# Patient Record
Sex: Female | Born: 1945 | Race: White | Hispanic: No | Marital: Married | Smoking: Former smoker
Health system: Southern US, Community
[De-identification: ages and names within clinical notes are randomized; demographics above are authoritative.]

## PROBLEM LIST (undated history)

## (undated) DIAGNOSIS — R55 Syncope and collapse: Secondary | ICD-10-CM

## (undated) DIAGNOSIS — I2510 Atherosclerotic heart disease of native coronary artery without angina pectoris: Secondary | ICD-10-CM

## (undated) DIAGNOSIS — H40059 Ocular hypertension, unspecified eye: Secondary | ICD-10-CM

## (undated) DIAGNOSIS — J309 Allergic rhinitis, unspecified: Secondary | ICD-10-CM

## (undated) HISTORY — PX: TONSILLECTOMY: SUR1361

## (undated) HISTORY — PX: APPENDECTOMY: SHX54

## (undated) HISTORY — PX: TONSILLECTOMY: SHX5217

## (undated) HISTORY — PX: ABDOMINAL HYSTERECTOMY: SHX81

## (undated) HISTORY — PX: PACEMAKER INSERTION: SHX728

## (undated) HISTORY — PX: TUBAL LIGATION: SHX77

---

## 1997-10-17 ENCOUNTER — Other Ambulatory Visit: Admission: RE | Admit: 1997-10-17 | Discharge: 1997-10-17 | Payer: Self-pay | Admitting: Dermatology

## 2000-01-22 ENCOUNTER — Ambulatory Visit (HOSPITAL_COMMUNITY): Admission: RE | Admit: 2000-01-22 | Discharge: 2000-01-22 | Payer: Self-pay | Admitting: Gastroenterology

## 2000-01-26 ENCOUNTER — Emergency Department (HOSPITAL_COMMUNITY): Admission: EM | Admit: 2000-01-26 | Discharge: 2000-01-26 | Payer: Self-pay | Admitting: Emergency Medicine

## 2001-07-19 ENCOUNTER — Encounter: Admission: RE | Admit: 2001-07-19 | Discharge: 2001-07-19 | Payer: Self-pay | Admitting: Orthopaedic Surgery

## 2001-07-19 ENCOUNTER — Encounter: Payer: Self-pay | Admitting: Orthopaedic Surgery

## 2001-12-14 ENCOUNTER — Other Ambulatory Visit: Admission: RE | Admit: 2001-12-14 | Discharge: 2001-12-14 | Payer: Self-pay | Admitting: Obstetrics and Gynecology

## 2002-09-20 ENCOUNTER — Other Ambulatory Visit: Admission: RE | Admit: 2002-09-20 | Discharge: 2002-09-20 | Payer: Self-pay | Admitting: Radiology

## 2003-05-16 ENCOUNTER — Encounter: Admission: RE | Admit: 2003-05-16 | Discharge: 2003-05-16 | Payer: Self-pay | Admitting: Orthopaedic Surgery

## 2003-05-16 ENCOUNTER — Encounter: Payer: Self-pay | Admitting: Orthopaedic Surgery

## 2003-11-02 ENCOUNTER — Encounter: Admission: RE | Admit: 2003-11-02 | Discharge: 2003-11-02 | Payer: Self-pay | Admitting: Orthopaedic Surgery

## 2003-11-23 ENCOUNTER — Encounter: Admission: RE | Admit: 2003-11-23 | Discharge: 2003-11-23 | Payer: Self-pay | Admitting: Orthopaedic Surgery

## 2004-02-02 ENCOUNTER — Encounter: Admission: RE | Admit: 2004-02-02 | Discharge: 2004-02-02 | Payer: Self-pay | Admitting: Nephrology

## 2004-02-06 ENCOUNTER — Encounter: Admission: RE | Admit: 2004-02-06 | Discharge: 2004-02-06 | Payer: Self-pay | Admitting: Orthopaedic Surgery

## 2004-10-12 ENCOUNTER — Encounter: Admission: RE | Admit: 2004-10-12 | Discharge: 2004-10-12 | Payer: Self-pay

## 2006-05-21 ENCOUNTER — Encounter: Admission: RE | Admit: 2006-05-21 | Discharge: 2006-05-21 | Payer: Self-pay | Admitting: Obstetrics and Gynecology

## 2006-05-28 ENCOUNTER — Encounter: Admission: RE | Admit: 2006-05-28 | Discharge: 2006-05-28 | Payer: Self-pay | Admitting: Obstetrics and Gynecology

## 2007-05-25 ENCOUNTER — Encounter: Admission: RE | Admit: 2007-05-25 | Discharge: 2007-05-25 | Payer: Self-pay | Admitting: Obstetrics and Gynecology

## 2008-06-02 ENCOUNTER — Encounter: Admission: RE | Admit: 2008-06-02 | Discharge: 2008-06-02 | Payer: Self-pay | Admitting: Obstetrics and Gynecology

## 2009-06-05 ENCOUNTER — Encounter: Admission: RE | Admit: 2009-06-05 | Discharge: 2009-06-05 | Payer: Self-pay | Admitting: Obstetrics and Gynecology

## 2009-06-07 ENCOUNTER — Encounter: Admission: RE | Admit: 2009-06-07 | Discharge: 2009-06-07 | Payer: Self-pay | Admitting: Obstetrics and Gynecology

## 2010-06-13 ENCOUNTER — Encounter: Admission: RE | Admit: 2010-06-13 | Discharge: 2010-06-13 | Payer: Self-pay | Admitting: Geriatric Medicine

## 2010-08-19 ENCOUNTER — Encounter: Payer: Self-pay | Admitting: Orthopaedic Surgery

## 2010-08-19 ENCOUNTER — Encounter: Payer: Self-pay | Admitting: Obstetrics and Gynecology

## 2010-12-14 NOTE — Procedures (Signed)
[facility]  Patient:    Deborah Anderson, Deborah Anderson                         MRN: [id number] Proc. Date: 01/22/00 Adm. Date:  [id number] Attending:  Deneen Harts CC:         Lucrezia Starch. August Luz, M.D., [address] [place], [place] [address]             Eliberto Ivory. Rosalio Macadamia, M.D.                           Procedure Report  PROCEDURE:  Colonoscopy.  INDICATION FOR PROCEDURE:  A 65 year old white female, wife of Dr. Tana Felts, undergoing colonoscopy for neoplasia surveillance. Last examined April 1998 at which time diminutive adenomatous polyps were resected. No interim difficulty. Overall heath remains excellent.  DESCRIPTION OF PROCEDURE:  After reviewing the nature of the procedure with the patient including potential risks and complications, informed consent was signed.  The patient was premedicated receiving Versed 12 mg, fentanyl 100 mcg administered in divided doses prior to and during the course of the procedure.  Using an Olympus pediatric PCF-140L video colonoscope, the rectum was intubated after a normal digital examination. The scope was advanced cautiously through a very tortuous diverticular laden sigmoid colon. Beyond this, it was easily advanced into the cecum identified by the appendiceal orifice and ileocecal valve. Preparation was fair in the right colon, good throughout the remainder. Significant retained debris was irrigated clear as much as possible. Visualization was excellent throughout.  The scope was slowly withdrawn with careful inspection of the entire colon in a retrograde manner. No abnormality was noted except for the sigmoid diverticulosis which was moderate in extent. Retroflexed view in the rectal vault was normal. There no evidence of colorectal neoplasia, mucosal inflammation, or vascular lesion.  The colon was decompressed, scope withdrawn.  The patient tolerated the procedure without difficulty being maintained on DataScope  monitor low flow oxygen throughout.  Time 2, technical 2, preparation 2, total score equal 6.  ASSESSMENT: 1. Diverticulosis--moderate, limited to the sigmoid colon. 2. No recurrent colorectal neoplasia.  RECOMMENDATION: 1. Annual Hemoccult. 2. Repeat colonoscopy in 5 years. DD:  01/22/00 TD:  01/23/00 Job: [id number]

## 2010-12-19 ENCOUNTER — Encounter: Payer: Self-pay | Admitting: Vascular Surgery

## 2010-12-26 ENCOUNTER — Encounter: Payer: Self-pay | Admitting: Vascular Surgery

## 2010-12-28 ENCOUNTER — Telehealth: Payer: Self-pay | Admitting: Cardiology

## 2010-12-28 DIAGNOSIS — R011 Cardiac murmur, unspecified: Secondary | ICD-10-CM

## 2010-12-28 NOTE — Telephone Encounter (Signed)
Called in wanting to be scheduled to see Dr. Patty Sermons. There is no record of her ever seeing him in this office. 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 Claims that her and Dr. Patty Sermons discussed how he really wanted her to come in for an check up but he didn't leave any special notes on her. Please call back. There is no chart to pull.

## 2011-01-01 NOTE — Telephone Encounter (Signed)
Left message

## 2011-01-02 NOTE — Telephone Encounter (Signed)
Scheduled appointment and echo

## 2011-01-04 ENCOUNTER — Ambulatory Visit
Admission: RE | Admit: 2011-01-04 | Discharge: 2011-01-04 | Disposition: A | Discharge status: Home / Self Care | Payer: Medicare Other | Source: Ambulatory Visit | Attending: Cardiology | Admitting: Cardiology

## 2011-01-04 ENCOUNTER — Encounter: Payer: Self-pay | Admitting: Cardiology

## 2011-01-04 ENCOUNTER — Other Ambulatory Visit: Payer: Self-pay | Admitting: Cardiology

## 2011-01-04 ENCOUNTER — Ambulatory Visit (INDEPENDENT_AMBULATORY_CARE_PROVIDER_SITE_OTHER): Payer: Medicare Other | Admitting: Cardiology

## 2011-01-04 VITALS — BP 100/70 | HR 62 | Ht 64.0 in | Wt 148.2 lb

## 2011-01-04 DIAGNOSIS — R011 Cardiac murmur, unspecified: Secondary | ICD-10-CM

## 2011-01-04 DIAGNOSIS — R5383 Other fatigue: Secondary | ICD-10-CM

## 2011-01-04 DIAGNOSIS — Z139 Encounter for screening, unspecified: Secondary | ICD-10-CM

## 2011-01-04 DIAGNOSIS — Z13220 Encounter for screening for lipoid disorders: Secondary | ICD-10-CM

## 2011-01-04 NOTE — Progress Notes (Signed)
Deborah Anderson Date of Birth:  15-Dec-1945 [facility] [address]   [address] [place], [place]  [address] [phone]           Fax   [phone]  HPI: This 78 on woman is seen after a many year absence.  She has a past history of a heart murmur.  She has not been aware of any recent symptoms referable to her heart.  Specifically she denies chest pain or shortness of breath or palpitations.  She's had no dizziness or syncope.  His not having any constitutional symptoms she notes occasional fatigue but generally her energy level has been satisfactory.  She is a recent new grandmother to a set of twins  Current Outpatient Prescriptions  Medication Sig Dispense Refill  . ALPRAZolam (XANAX) 0.5 MG tablet Take 0.5 mg by mouth at bedtime as needed.        Marland Kitchen aspirin 81 MG tablet Take 81 mg by mouth daily.        . Multiple Vitamin (MULTIVITAMIN) tablet Take 1 tablet by mouth daily.          Allergies  Allergen Reactions  . Penicillins     Patient Active Problem List  Diagnoses  . Heart murmur    History  Smoking status  . Former Smoker  . Quit date: 07/30/1967  Smokeless tobacco  . Not on file    History  Alcohol Use  . Yes    Family History  Problem Relation Age of Onset  . Lung cancer Mother   . Diabetes Father   . Diabetes Sister   . Obesity Sister     Review of Systems: The patient denies any heat or cold intolerance.  No weight gain or weight loss.  The patient denies headaches or blurry vision.  There is no cough or sputum production.  The patient denies dizziness.  There is no hematuria or hematochezia.  The patient denies any muscle aches or arthritis.  The patient denies any rash.  The patient denies frequent falling or instability.  There is no history of depression or anxiety.  All other systems were reviewed and are negative.   Physical Exam: Filed Vitals:   01/04/11 1028  BP: 100/70  Pulse: 62  The general appearance  reveals a well-developed well-nourished woman in no distress.Pupils equal and reactive.   Extraocular Movements are full.  There is no scleral icterus.  The mouth and pharynx are normal.  The neck is supple.  The carotids reveal no bruits.  The jugular venous pressure is normal.  The thyroid is not enlarged.  There is no lymphadenopathy.The chest is clear to percussion and auscultation. There are no rales or rhonchi. Expansion of the chest is symmetrical.  Heart reveals a soft systolic ejection murmur at the base.  No diastolic murmur.  No abnormal lift or heave.  No gallop click or rub.The abdomen is soft and nontender. Bowel sounds are normal. The liver and spleen are not enlarged. There Are no abdominal masses. There are no bruits.  The breasts reveal no masses.  Normal extremity without phlebitis or edema neurologic exam is physiologic.The skin is warm and dry.  There is no rash.    Assessment / Plan: Impression: Systolic heart murmur uncertain etiology Plan: We'll have her return for baseline labs.  She does not know what her cholesterol status is.  Her return for fasting lipid panel and chemistries.  We will also obtain a chest x-ray and  a two-dimensional echocardiogram.

## 2011-01-04 NOTE — Assessment & Plan Note (Signed)
This pleasant 65 year old married Caucasian female is seen after a long absence.  She has a past history of a heart murmur.  We had seen her for this many years ago here in this office but those records are not currently available.  The patient does not recall what tests were done at that time.  The patient denies any chest pain or shortness of breath.  She's had no awareness of palpitations dizziness or syncope.  He's had no fever or chills or night sweats or constitutional symptoms.  Review of systems is essentially unremarkable.  She denies any gastrointestinal or genitourinary problems.  She denies any cough sputum production or asthma.  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  She is a nonsmoker.  She smoked for about 5 years many years ago.  She avoids much dietary salt.

## 2011-01-07 ENCOUNTER — Ambulatory Visit (HOSPITAL_COMMUNITY): Payer: Medicare Other | Attending: Cardiology

## 2011-01-07 ENCOUNTER — Telehealth: Payer: Self-pay | Admitting: *Deleted

## 2011-01-07 DIAGNOSIS — R011 Cardiac murmur, unspecified: Secondary | ICD-10-CM

## 2011-01-07 DIAGNOSIS — I059 Rheumatic mitral valve disease, unspecified: Secondary | ICD-10-CM | POA: Insufficient documentation

## 2011-01-07 DIAGNOSIS — R5381 Other malaise: Secondary | ICD-10-CM | POA: Insufficient documentation

## 2011-01-07 DIAGNOSIS — I079 Rheumatic tricuspid valve disease, unspecified: Secondary | ICD-10-CM | POA: Insufficient documentation

## 2011-01-07 NOTE — Telephone Encounter (Signed)
Advised of chest xray 

## 2011-01-07 NOTE — Progress Notes (Signed)
Advised wife  

## 2011-01-07 NOTE — Telephone Encounter (Signed)
Message copied by Burnell Blanks on Mon Jan 07, 2011  4:44 PM ------      Message from: Cassell Clement      Created: Sat Jan 05, 2011  9:25 AM       The chest xray is normal.  Please report.

## 2011-01-09 ENCOUNTER — Telehealth: Payer: Self-pay | Admitting: *Deleted

## 2011-01-09 ENCOUNTER — Encounter: Payer: Self-pay | Admitting: Vascular Surgery

## 2011-01-09 NOTE — Telephone Encounter (Signed)
Message copied by Burnell Blanks on Wed Jan 09, 2011  1:52 PM ------      Message from: Cassell Clement      Created: Tue Jan 08, 2011  9:30 PM       I called the patient about her echocardiogram which was quite good.  We are faxing copies of the echo and the chest x-ray to her home.  Recheck p.r.n.

## 2011-01-09 NOTE — Telephone Encounter (Signed)
Dr Patty Sermons advised patient.  I faxed to patient

## 2011-01-10 ENCOUNTER — Other Ambulatory Visit (HOSPITAL_COMMUNITY): Payer: Self-pay | Admitting: Radiology

## 2011-01-10 ENCOUNTER — Encounter: Payer: Self-pay | Admitting: Cardiology

## 2011-01-11 ENCOUNTER — Other Ambulatory Visit (HOSPITAL_COMMUNITY): Payer: Self-pay | Admitting: Radiology

## 2011-07-02 ENCOUNTER — Other Ambulatory Visit: Payer: Self-pay | Admitting: Geriatric Medicine

## 2011-07-02 DIAGNOSIS — Z1231 Encounter for screening mammogram for malignant neoplasm of breast: Secondary | ICD-10-CM

## 2011-07-05 ENCOUNTER — Encounter (HOSPITAL_COMMUNITY): Payer: Self-pay | Admitting: Emergency Medicine

## 2011-07-05 ENCOUNTER — Other Ambulatory Visit: Payer: Self-pay

## 2011-07-05 ENCOUNTER — Inpatient Hospital Stay (HOSPITAL_COMMUNITY)
Admission: EM | Admit: 2011-07-05 | Discharge: 2011-07-09 | DRG: 242 | Disposition: A | Discharge status: Home / Self Care | Payer: Medicare Other | Attending: Cardiology | Admitting: Cardiology

## 2011-07-05 DIAGNOSIS — I469 Cardiac arrest, cause unspecified: Secondary | ICD-10-CM | POA: Diagnosis present

## 2011-07-05 DIAGNOSIS — I495 Sick sinus syndrome: Principal | ICD-10-CM | POA: Diagnosis present

## 2011-07-05 DIAGNOSIS — R55 Syncope and collapse: Secondary | ICD-10-CM

## 2011-07-05 DIAGNOSIS — I455 Other specified heart block: Secondary | ICD-10-CM

## 2011-07-05 DIAGNOSIS — I472 Ventricular tachycardia, unspecified: Secondary | ICD-10-CM | POA: Diagnosis present

## 2011-07-05 DIAGNOSIS — I4729 Other ventricular tachycardia: Secondary | ICD-10-CM

## 2011-07-05 DIAGNOSIS — I2510 Atherosclerotic heart disease of native coronary artery without angina pectoris: Secondary | ICD-10-CM

## 2011-07-05 HISTORY — DX: Allergic rhinitis, unspecified: J30.9

## 2011-07-05 HISTORY — DX: Ocular hypertension, unspecified eye: H40.059

## 2011-07-05 HISTORY — DX: Syncope and collapse: R55

## 2011-07-05 HISTORY — DX: Atherosclerotic heart disease of native coronary artery without angina pectoris: I25.10

## 2011-07-05 LAB — DIFFERENTIAL
Eosinophils Absolute: 0.1 10*3/uL (ref 0.0–0.7)
Lymphocytes Relative: 16 % (ref 12–46)
Monocytes Absolute: 0.5 10*3/uL (ref 0.1–1.0)
Neutro Abs: 6.4 10*3/uL (ref 1.7–7.7)
Neutrophils Relative %: 78 % — ABNORMAL HIGH (ref 43–77)

## 2011-07-05 LAB — CBC
Hemoglobin: 12.6 g/dL (ref 12.0–15.0)
MCH: 30.2 pg (ref 26.0–34.0)
MCV: 89.4 fL (ref 78.0–100.0)
Platelets: 265 10*3/uL (ref 150–400)
RDW: 12.6 % (ref 11.5–15.5)
WBC: 8.3 10*3/uL (ref 4.0–10.5)

## 2011-07-05 LAB — BASIC METABOLIC PANEL
BUN: 19 mg/dL (ref 6–23)
CO2: 26 mEq/L (ref 19–32)
Creatinine, Ser: 0.56 mg/dL (ref 0.50–1.10)
GFR calc Af Amer: >90 mL/min (ref >90)
GFR calc non Af Amer: >90 mL/min (ref >90)
Glucose, Bld: 144 mg/dL — ABNORMAL HIGH (ref 70–99)

## 2011-07-05 LAB — URINE MICROSCOPIC-ADD ON

## 2011-07-05 LAB — URINALYSIS, ROUTINE W REFLEX MICROSCOPIC: Specific Gravity, Urine: 1.019 (ref 1.005–1.030)

## 2011-07-05 MED ORDER — SODIUM CHLORIDE 0.9 % IV SOLN
INTRAVENOUS | Status: DC
  Administered 2011-07-05 – 2011-07-06 (×2): via INTRAVENOUS

## 2011-07-05 NOTE — ED Notes (Signed)
Pt to restroom via wheelchair. No difficulty.

## 2011-07-05 NOTE — ED Notes (Signed)
ekg given to dr. Weldon Inches.

## 2011-07-05 NOTE — ED Notes (Signed)
MD made aware of pt bradying to 47 and feeling a tightness in chest. MD at bedside.

## 2011-07-05 NOTE — ED Notes (Signed)
Per EMS, pt was laying on the floor reading and the pt had a syncopal episode. Husband started chest compressions x 30 seconds. Denies n/v/d, sob, or cp. Per pt husband, she is "acting a little confused, not normal for her".

## 2011-07-05 NOTE — ED Provider Notes (Signed)
History     CSN: [id number] Arrival date & time: 07/05/2011  9:32 PM   First MD Initiated Contact with Patient 07/05/11 2145      Chief Complaint  Patient presents with  . Loss of Consciousness    (Consider location/radiation/quality/duration/timing/severity/associated sxs/prior treatment) Patient is a 65 y.o. female presenting with syncope. The history is provided by the patient and the spouse.  Loss of Consciousness Pertinent negatives include no chest pain, no abdominal pain, no headaches and no shortness of breath.   the patient is a 65 year old, female, with no significant past medical history.  She and her husband had traveled from [place] and returned approximately 2 weeks ago.  Earlier this evening.  She had walked out of the kitchen and told her husband that she is feeling faint.  She slumped to the ground.  He checked her blood pressure, and her systolic blood pressure was in the 80s.  Later in the evening.  They were sitting on the floor reading, and she passed out.  Her husband states that she was shaking, as if she had a seizure.  However, she was not incontinent of her bowel or bladder, and she did not bite her tongue.  She denies recent illness.  She denies palpitations.  Vision changes, nausea or vomiting, headache, or weakness.  Prior to her passing out.  She denies leg pain or swelling.  She denies chest pain, and shortness of breath.  She has not had any urinary tract symptoms.  Her husband also says that she is seemed to be more confused.  Past Medical History  Diagnosis Date  . Heart murmur   . Intraocular pressure increase     Past Surgical History  Procedure Date  . Appendectomy   . Tonsillectomy     Family History  Problem Relation Age of Onset  . Lung cancer Mother   . Diabetes Father   . Diabetes Sister   . Obesity Sister     History  Substance Use Topics  . Smoking status: Former Smoker    Quit date: 07/30/1967  . Smokeless tobacco: Not on file    . Alcohol Use: 0.6 oz/week    1 Glasses of wine per week     One glass of wine with dinner daily    OB History    Grav Para Term Preterm Abortions TAB SAB Ect Mult Living                  Review of Systems  Constitutional: Negative for fever.  HENT: Negative for congestion.   Eyes: Negative for visual disturbance.  Respiratory: Negative for cough and shortness of breath.   Cardiovascular: Positive for syncope. Negative for chest pain and palpitations.  Gastrointestinal: Negative for nausea, vomiting and abdominal pain.  Genitourinary: Negative for dysuria.  Skin: Negative for rash.  Neurological: Positive for seizures and syncope. Negative for headaches.       Her husband says that she had a seizure.  However, his description is more consistent with tonic-clonic jerking following a syncopal episode.  He does not report postictal confusion.  Hematological: Does not bruise/bleed easily.  Psychiatric/Behavioral: Positive for confusion.       Her husband reports confusion, which has been ongoing for several days rather than post ictal type confusion.  After, what he considered to be a seizure    Allergies  Review of patient's allergies indicates no known allergies.  Home Medications   Current Outpatient Rx  Name Route Sig Dispense  Refill  . ALPRAZOLAM 0.5 MG PO TABS Oral Take 0.5 mg by mouth at bedtime as needed. For sleep    . ASPIRIN 81 MG PO TABS Oral Take 81 mg by mouth daily. Does not take on a daily basis    . MELATONIN 3 MG PO CAPS Oral Take 1 capsule by mouth at bedtime as needed. For sleep     . ONE-DAILY MULTI VITAMINS PO TABS Oral Take 1 tablet by mouth daily.      Marland Kitchen TIMOLOL HEMIHYDRATE 0.25 % OP SOLN Both Eyes Place 1 drop into both eyes every morning.        BP 153/70  Pulse 77  Temp(Src) 98.4 F (36.9 C) (Oral)  Resp 21  SpO2 96%  Physical Exam  Constitutional: She is oriented to person, place, and time. She appears well-developed and well-nourished.   HENT:  Head: Normocephalic and atraumatic.  Eyes: Pupils are equal, round, and reactive to light.  Neck: Normal range of motion.       Left sided carotid bruit  Cardiovascular: Normal rate and regular rhythm.   Murmur heard.      Systolic murmur in the right and left upper parasternal areas  Pulmonary/Chest: Effort normal and breath sounds normal. No respiratory distress. She has no wheezes. She has no rales.  Abdominal: Soft. She exhibits no distension and no mass. There is no tenderness. There is no rebound and no guarding.  Musculoskeletal: Normal range of motion. She exhibits no edema and no tenderness.  Neurological: She is alert and oriented to person, place, and time.       No facial asymmetry.  She moves all extremities spontaneously  Skin: Skin is warm and dry. No rash noted. No erythema.  Psychiatric: She has a normal mood and affect. Her behavior is normal.    ED Course  Procedures (including critical care time) 65 year old, female, with no significant past medical history presents with one episode of near-syncope, with a measured blood pressure of 87, followed by another episode of syncope.  She has had a recent international travel though she has no chest pain, shortness breath, or Ultram.  The swelling.  She also has a left carotid bruit, and cardiac murmur.  We will perform a chest CT to evaluate for pulmonary embolism.  Due to her recent prolonged travel and syncope.  I'll we'll also perform a CT of her head given her reported history of gradual confusion and seizure.  This evening.  However, I expect that this will be normal.  I do not think that she has had a true seizure and I do not think she will have a focal lesion in her brain.  We will also perform laboratory testing, and an EKG, to search for other more common etiologies for fainting.  She is asymptomatic at this time and there is no indication for acute intervention.  Her PCP is Dr. Patty Sermons.  Labs Reviewed   URINALYSIS, ROUTINE W REFLEX MICROSCOPIC - Abnormal; Notable for the following:    Leukocytes, UA MODERATE (*)    All other components within normal limits  URINE MICROSCOPIC-ADD ON  CBC  DIFFERENTIAL  BASIC METABOLIC PANEL  CARDIAC PANEL(CRET KIN+CKTOT+MB+TROPI)   No results found.   11:56 PM She had a bradycardic episode with a heart rate going into the 40s while lying in bed.  She did not pass out.  I will consult cardiology, for admission.  12:13 AM I spoke with the cardiologist.  She will come to evaluate  the patient and admit her to the hospital for evaluation of suspected cardiogenic syncope.   MDM  Syncope with episodes of bradycardia and hypotension.        Nicholes Stairs, MD 07/06/11 [phone]

## 2011-07-06 ENCOUNTER — Emergency Department (HOSPITAL_COMMUNITY): Payer: Medicare Other

## 2011-07-06 ENCOUNTER — Encounter (HOSPITAL_COMMUNITY): Payer: Self-pay | Admitting: *Deleted

## 2011-07-06 ENCOUNTER — Other Ambulatory Visit: Payer: Self-pay

## 2011-07-06 ENCOUNTER — Encounter (HOSPITAL_COMMUNITY)
Admission: EM | Disposition: A | Discharge status: Home / Self Care | Payer: Self-pay | Source: Home / Self Care | Attending: Internal Medicine

## 2011-07-06 DIAGNOSIS — R55 Syncope and collapse: Secondary | ICD-10-CM

## 2011-07-06 DIAGNOSIS — I469 Cardiac arrest, cause unspecified: Secondary | ICD-10-CM

## 2011-07-06 HISTORY — PX: LEFT HEART CATHETERIZATION WITH CORONARY ANGIOGRAM: SHX5451

## 2011-07-06 HISTORY — PX: PERCUTANEOUS CORONARY STENT INTERVENTION (PCI-S): SHX5485

## 2011-07-06 HISTORY — PX: TEMPORARY PACEMAKER INSERTION: SHX5471

## 2011-07-06 LAB — CBC
MCH: 30.2 pg (ref 26.0–34.0)
MCV: 89.5 fL (ref 78.0–100.0)
Platelets: 267 10*3/uL (ref 150–400)
RDW: 12.6 % (ref 11.5–15.5)

## 2011-07-06 LAB — BASIC METABOLIC PANEL
BUN: 12 mg/dL (ref 6–23)
CO2: 27 mEq/L (ref 19–32)
CO2: 25 mEq/L (ref 19–32)
Calcium: 9.1 mg/dL (ref 8.4–10.5)
Calcium: 8.4 mg/dL (ref 8.4–10.5)
Chloride: 106 mEq/L (ref 96–112)
Creatinine, Ser: 0.62 mg/dL (ref 0.50–1.10)
GFR calc Af Amer: >90 mL/min (ref >90)
Glucose, Bld: 119 mg/dL — ABNORMAL HIGH (ref 70–99)
Sodium: 139 mEq/L (ref 135–145)

## 2011-07-06 LAB — CK TOTAL AND CKMB (NOT AT ARMC): Total CK: 92 U/L (ref 7–177)

## 2011-07-06 LAB — MRSA PCR SCREENING: MRSA by PCR: NEGATIVE

## 2011-07-06 LAB — TROPONIN I
Troponin I: <0.3 ng/mL (ref <0.30)
Troponin I: <0.3 ng/mL (ref <0.30)

## 2011-07-06 SURGERY — TEMPORARY PACEMAKER INSERTION
Anesthesia: LOCAL

## 2011-07-06 MED ORDER — CLOPIDOGREL BISULFATE 300 MG PO TABS
ORAL_TABLET | ORAL | Status: AC
  Administered 2011-07-07: 75 mg via ORAL
  Filled 2011-07-06: qty 2

## 2011-07-06 MED ORDER — CLOPIDOGREL BISULFATE 75 MG PO TABS
75.0000 mg | ORAL_TABLET | Freq: Every day | ORAL | Status: DC
  Administered 2011-07-07 – 2011-07-08 (×2): 75 mg via ORAL
  Filled 2011-07-06 (×3): qty 1

## 2011-07-06 MED ORDER — SODIUM CHLORIDE 0.9 % IV SOLN
250.0000 mL | INTRAVENOUS | Status: DC | PRN
  Administered 2011-07-06: 250 mL via INTRAVENOUS

## 2011-07-06 MED ORDER — SODIUM CHLORIDE 0.9 % IJ SOLN
3.0000 mL | INTRAMUSCULAR | Status: DC | PRN
  Administered 2011-07-06: 3 mL via INTRAVENOUS

## 2011-07-06 MED ORDER — LIDOCAINE HCL (PF) 1 % IJ SOLN
INTRAMUSCULAR | Status: AC
  Filled 2011-07-06: qty 30

## 2011-07-06 MED ORDER — BIVALIRUDIN 250 MG IV SOLR
INTRAVENOUS | Status: AC
  Filled 2011-07-06: qty 250

## 2011-07-06 MED ORDER — FENTANYL CITRATE 0.05 MG/ML IJ SOLN
INTRAMUSCULAR | Status: AC
  Filled 2011-07-06: qty 2

## 2011-07-06 MED ORDER — SODIUM CHLORIDE 0.9 % IJ SOLN
3.0000 mL | Freq: Two times a day (BID) | INTRAMUSCULAR | Status: DC
  Administered 2011-07-07 – 2011-07-08 (×4): 3 mL via INTRAVENOUS

## 2011-07-06 MED ORDER — POTASSIUM CHLORIDE CRYS ER 20 MEQ PO TBCR
40.0000 meq | EXTENDED_RELEASE_TABLET | Freq: Once | ORAL | Status: AC
  Administered 2011-07-06: 40 meq via ORAL
  Filled 2011-07-06: qty 2

## 2011-07-06 MED ORDER — HEPARIN (PORCINE) IN NACL 2-0.9 UNIT/ML-% IJ SOLN
INTRAMUSCULAR | Status: AC
  Filled 2011-07-06: qty 2000

## 2011-07-06 MED ORDER — MIDAZOLAM HCL 2 MG/2ML IJ SOLN
INTRAMUSCULAR | Status: AC
  Filled 2011-07-06: qty 2

## 2011-07-06 MED ORDER — ZOLPIDEM TARTRATE 5 MG PO TABS: 5.0000 mg | ORAL_TABLET | Freq: Every evening | ORAL | Status: DC | PRN

## 2011-07-06 MED ORDER — ONDANSETRON HCL 4 MG/2ML IJ SOLN: 4.0000 mg | Freq: Four times a day (QID) | INTRAMUSCULAR | Status: DC | PRN

## 2011-07-06 MED ORDER — TIMOLOL MALEATE 0.25 % OP SOLN
1.0000 [drp] | Freq: Every day | OPHTHALMIC | Status: DC
  Administered 2011-07-07 – 2011-07-08 (×2): 1 [drp] via OPHTHALMIC
  Filled 2011-07-06: qty 5

## 2011-07-06 MED ORDER — ASPIRIN 81 MG PO CHEW
81.0000 mg | CHEWABLE_TABLET | Freq: Every day | ORAL | Status: DC
  Administered 2011-07-06 – 2011-07-08 (×3): 81 mg via ORAL
  Filled 2011-07-06 (×3): qty 1

## 2011-07-06 MED ORDER — IOHEXOL 350 MG/ML SOLN
100.0000 mL | Freq: Once | INTRAVENOUS | Status: AC | PRN
  Administered 2011-07-06: 100 mL via INTRAVENOUS

## 2011-07-06 MED ORDER — ENOXAPARIN SODIUM 40 MG/0.4ML ~~LOC~~ SOLN: 40.0000 mg | SUBCUTANEOUS | Status: DC

## 2011-07-06 MED ORDER — TIMOLOL HEMIHYDRATE 0.25 % OP SOLN: 1.0000 [drp] | OPHTHALMIC | Status: DC

## 2011-07-06 MED ORDER — SODIUM CHLORIDE 0.9 % IV SOLN
1.0000 mL/kg/h | INTRAVENOUS | Status: AC
  Administered 2011-07-06: 1 mL/kg/h via INTRAVENOUS

## 2011-07-06 MED ORDER — ALPRAZOLAM 0.5 MG PO TABS
0.5000 mg | ORAL_TABLET | Freq: Every evening | ORAL | Status: DC | PRN
  Administered 2011-07-06 – 2011-07-07 (×2): 0.5 mg via ORAL
  Filled 2011-07-06 (×3): qty 1

## 2011-07-06 MED ORDER — NITROGLYCERIN 0.2 MG/ML ON CALL CATH LAB
INTRAVENOUS | Status: AC
  Filled 2011-07-06: qty 1

## 2011-07-06 MED ORDER — ACETAMINOPHEN 325 MG PO TABS
650.0000 mg | ORAL_TABLET | ORAL | Status: DC | PRN
  Administered 2011-07-06 – 2011-07-08 (×5): 650 mg via ORAL
  Filled 2011-07-06 (×5): qty 2

## 2011-07-06 NOTE — Op Note (Signed)
PROCEDURE:  Left heart catheterization with selective coronary angiography, left ventriculogram.  INDICATIONS:    The risks, benefits, and details of the procedure were explained to the patient.  The patient verbalized understanding and wanted to proceed.  Informed written consent was obtained.  PROCEDURE TECHNIQUE:  After Xylocaine anesthesia a 12F sheath was placed in the right femoral vein and a 6 F sheath to the artery with single anterior needle wall sticks.  Temporary pacemaker was placed through the right femoral vein sheath.  Capture was tested.  Capture was lost at 0.6 milliamps.  The pacer was set to 50 beats per minute and 1 milliamp output. Left coronary angiography was done using a Judkins L4 guide catheter.  Right coronary angiography was done using a Judkins R4 guide catheter.  Left ventriculography was done using a pigtail catheter.  Angiomax was administered.  The PCI was then performed.  Intracoronary nitroglycerin was administered to treat vasospasm in the circumflex  vessel.  The arterial sheath will be removed using manual compression.   CONTRAST:  Total of 103 cc.  COMPLICATIONS:  None.    HEMODYNAMICS:  Aortic pressure was 108/60; LV pressure was 108/7; LVEDP 14.  There was no gradient between the left ventricle and aorta.    ANGIOGRAPHIC DATA:   The left main coronary artery is widely patent.  The left anterior descending artery is is a large vessel which reaches the apex.  There is a large septal perforator which is widely patent.  There is a large diagonal vessel which also appears widely patent.  There are two smaller diagonals more distally better patent..  The left circumflex artery is is a large vessel.  There is a medium-sized OM 1 which is widely patent.  In the mid circumflex, there is a focal, hazy 80% stenosis.  Beyond the stenosis, there is a large second obtuse marginal.  This marginal vessel is widely patent.  The right coronary artery is is a large dominant  vessel.  The PDA is small but patent.  The posterior lateral artery is medium-sized and widely patent.  LEFT VENTRICULOGRAM:  Left ventricular angiogram was done in the 30 RAO projection and revealed normal left ventricular wall motion and systolic function with an estimated ejection fraction of 60 %.  LVEDP was 14 mmHg.  PCI NARRATIVE:  The left main was engaged with a CLS 3.5 guiding catheter.  An ACT was used to confirm that the Angiomax was therapeutic.  A pro water wire was placed across the lesion circumflex.  The lesion was predilated with a 2.5 x 12 immerge balloon inflated to 8 atmospheres.  The area was stented with a 2.75 x 16 Promus element stent deployed at 11 atmospheres.  The stent was postdilated with a 3.0 x 12 Quantum apex balloon Inflated to 16 atmospheres.  There is no residual stenosis.  Lesion length was 12 mm.  IMPRESSIONS:  1. Normal left main coronary artery. 2. Normal left anterior descending artery and its branches. 3. 80% mid circumflex lesion, that was successfully stented with a drug-eluting stent.  2.75 x 16 Promus element stent post dilated to greater than 3 mm in diameter. 4. Normal right coronary artery. 5. Normal left ventricular systolic function.  LVEDP 14 mmHg.  Ejection fraction 60%. 6.   Successful transvenous pacemaker placement from the right femoral vein.  RECOMMENDATION:  She'll be watched in the CCU.  Plan for permanent pacemaker on Monday.  She will need dual antiplatelet therapy for at least a year.  Continue aggressive secondary prevention.  She'll likely need a statin.

## 2011-07-06 NOTE — Procedures (Signed)
60fr right groin arterial sheath removed with manual hold at 1537. Patient tolerated procedure well. Hemostasis noted at 1557. Rt groin remains level 0 post hemostasis. Transparent dressing applied and patient education completed. Rt groin venous sheath remains intact with absence of oozing or hematoma. Will continue to monitor site closely.

## 2011-07-06 NOTE — Progress Notes (Signed)
*  PRELIMINARY RESULTS* Echocardiogram 2D Echocardiogram has been performed.  Clide Deutscher RDCS 07/06/2011, 3:26 PM

## 2011-07-06 NOTE — Significant Event (Signed)
Rapid Response Event Note  Overview: Time Called: 0950 Arrival Time: 0952 Event Type: Cardiac  Initial Focused Assessment: Called to room by Code [facility] page which was subsequently cancelled. Pt was awake, alert and conversant. Reported ly husband started CPR when she 'felt funny,' then lost consciousness and her eyes rolled back in her head. Review of EKG monitor rhythms showed onset of bradycardia followed by asystole, several short bursts of VTach with intermittent asystole, then resumption of sinus rhythm.   Interventions: Defibrillator pads placed on patient, Manual BP taken, Adeline O2 applied and patient was continually monitored on Zoll bedside defibrillator and at remote monitor station until transfer bed was secured and pateint was transferred to 2914. No further incidents occurred during my tenure; patient remained awake and alert.    Event Summary: Name of Physician Notified: Crenshaw at 1000    at    Outcome: Transferred (Comment) transferred to 2914; for temporary pacemaker and evaluation.  Event End Time: 1052  Kristine Linea

## 2011-07-06 NOTE — Progress Notes (Addendum)
Chaplain's Note:  Responded to Code Blue and referral.  Offered pastoral presence to pt and husband at bedside.  Pt alert, Code status cancelled.  Remained present and offered to be available as needed or requested.

## 2011-07-06 NOTE — H&P (Signed)
Physician History and Physical    Deborah Anderson MRN: [id number] DOB/AGE: 12-26-1945 65 y.o. Admit date: 07/05/2011  Primary Cardiologist Dr. Patty Sermons  WU:[id number]  HPI:  Pt is a 65 yo woman with past hx of mild mitral regurgitation who presents with syncope today.  At 615 pm, pt felt 'funny' and dizzy and then fell, caught herself.  Husband, who is a nephrologist, checked her BP and SBP 80s and pt was noted to be orthostatic.  Her pulse was regular at the time.  She increased the amount of fluids she drank and increased her salt intake and felt better.  Then at 830 pm, she was sitting on the ground and slumped over.  This was witnessed by her husband.  He didn't think he felt a pulse, so he started chest compressions.  She was out for about 30 sec by his recollection.  He noted some jerking movements and then she came to.  She felt dizzy when she came to, but was not confused.  She did not have any observed bowel or bladder incontinence.  She has never had sx like this before.  She did not have CP, SOB, palpitations prior to the episode.  She denies LE edema, decreased exercise tolerance, PND, orthopnea.  She just returned from a trip to [place] and was very active there.  She also hikes with her husband in [place] and they get periodic testing for lyme disease - last titers were negative a few months ago.  At this time, pt feels back to normal after getting IVFs in ED.  Pt reportedly had an episode of sinus brady to 40s in ED and became symptomatic with this, but it was short lived.     Review of systems: A review of 10 organ systems was done and is negative except as stated above in HPI  Past Medical History  Diagnosis Date  . Heart murmur   . Intraocular pressure increase    Past Surgical History  Procedure Date  . Appendectomy   . Tonsillectomy    History   Social History  . Marital Status: Married    Spouse Name: N/A    Number of Children: N/A  . Years of Education: N/A    Occupational History  . Not on file.   Social History Main Topics  . Smoking status: Former Smoker    Quit date: 07/30/1967  . Smokeless tobacco: Not on file  . Alcohol Use: 0.6 oz/week    1 Glasses of wine per week     One glass of wine with dinner daily  . Drug Use: No  . Sexually Active: Not on file   Other Topics Concern  . Not on file   Social History Narrative  . No narrative on file    Family History  Problem Relation Age of Onset  . Lung cancer Mother   . Diabetes Father   . Diabetes Sister   . Obesity Sister      No Known Allergies Home meds: Xanax prn Melatonin prn Eye drops  Current facility-administered medications:0.9 %  sodium chloride infusion, , Intravenous, Continuous, Nicholes Stairs, MD, Last Rate: 125 mL/hr at 07/05/11 2253;  iohexol (OMNIPAQUE) 350 MG/ML injection 100 mL, 100 mL, Intravenous, Once PRN, Medication Radiologist, 100 mL at 07/06/11 0027 Current outpatient prescriptions:ALPRAZolam (XANAX) 0.5 MG tablet, Take 0.5 mg by mouth at bedtime as needed. For sleep, Disp: , Rfl: ;  aspirin 81 MG tablet, Take 81 mg by mouth daily. Does not  take on a daily basis, Disp: , Rfl: ;  Melatonin 3 MG CAPS, Take 1 capsule by mouth at bedtime as needed. For sleep , Disp: , Rfl: ;  Multiple Vitamin (MULTIVITAMIN) tablet, Take 1 tablet by mouth daily.  , Disp: , Rfl:  timolol (BETIMOL) 0.25 % ophthalmic solution, Place 1 drop into both eyes every morning.  , Disp: , Rfl:   Physical Exam: Blood pressure 118/64, pulse 67, temperature 97.9 F (36.6 C), temperature source Oral, resp. rate 20, SpO2 97.00%.  General: NAD Heent: MMM Neck: No JVD, no carotid bruits CV: Heart regular S1/S2, no S3/S4, 1/6 SEM. No carotid bruit.   Lungs: Clear to auscultation bilaterally with normal respiratory effort. Abdomen: Soft, nontender, no hepatosplenomegaly, no distention.  Extremities: No clubbing or cyanosis. No pedal edema Skin: Intact without lesions or rashes.   Neurologic: Alert and oriented x 3.  Grossly nonfocal.  Normal strength and sensation all extr bilat.  CN 2-12 grossly intact  Psych: Normal mood and affect.    Labs: Lab Results  Component Value Date   WBC 8.3 07/05/2011   HGB 12.6 07/05/2011   HCT 37.3 07/05/2011   MCV 89.4 07/05/2011   PLT 265 07/05/2011    Lab 07/05/11 2253  NA 138  K 3.5  CL 102  CO2 26  BUN 19  CREATININE 0.56  CALCIUM 8.9  PROT --  BILITOT --  ALKPHOS --  ALT --  AST --  GLUCOSE 144*   NGE:XBMWU 65, no acute ischemic changes  CT head neg CTA chest neg for PE  Echo 6/12  - Left ventricle: The cavity size was normal. Wall thickness was increased in a pattern of mild LVH. Systolic function was normal. The estimated ejection fraction was in the range of 55% to 65%. Wall motion was normal; there were no regional wall motion abnormalities. Features are consistent with a pseudonormal left ventricular filling pattern, with concomitant abnormal relaxation and increased filling pressure (grade 2 diastolic dysfunction). - Mitral valve: Mild regurgitation. - Atrial septum: No defect or patent foramen ovale was identified.    ASSESSMENT: Pt is a 65 yo woman with past hx of mild mitral regurgitation who presents with syncope today.    PLAN: Syncope - first episode sounds orthostatic in nature, second episode happened w/o warning, concerning for arrhythmic cause.  CT head and CTA chest negative in ED.  EKG with no acute ischemic changes and no arrhythmia - recent echo with normal EF and mild MR, no aortic stenosis - no need to repeat - monitor on tele for arrhythmia (reportedly had HR in 40s at one point in ED) - check cardiac enzymes - if nothing seen on monitoring overnight, pt will likely need to be dc'd with extended monitor - repeat orthostatics s/p fluids in ED  Insomnia/Anxiety - prn xanax and ambien  Increased IOP  - continue eye gtt  Ppx - lovenox for DVT ppx  Dispo - admit to [facility]  Cardiology Service  Signed: Hilary Hertz, MD 07/06/2011, 1:38 AM

## 2011-07-06 NOTE — ED Notes (Signed)
Report called to Cybill RN on floor and ready for move.

## 2011-07-06 NOTE — Progress Notes (Signed)
07/06/11 0500 NURSING Pt rec'd to 6733-1 via wheelchair with husband at side. A&Ox4, with some memory impairment present. No distress noted. Denies pain. VSS. Telemetry in place. No contact precautions. Skin intact, but pt refused thorough skin assessment. Oriented to unit, room and surroundings. Educated on how to use the call bell. Will continue to monitor.  C.Brenleigh Collet, RN.

## 2011-07-06 NOTE — ED Notes (Signed)
Family at bedside.pt is back  From x-ray  Back on monitor

## 2011-07-06 NOTE — Progress Notes (Signed)
07/06/2011 monitor tech called at 0943 patient had 10 beats v-tach and then went into asystole. Code was called, Rn went to room patient was awake. Code was d/c. Patient vital sign after incident at 0950 was 142/66, 97.9, 65, 22, 92% RA. Rapid response was called and md was also notify and is aware. Shanecia Hoganson Banker)

## 2011-07-06 NOTE — H&P (Signed)
  Date of Initial H&P: 07/06/11 History reviewed, patient examined, no change in status, stable for cath, temporary pacer placement.

## 2011-07-06 NOTE — Progress Notes (Signed)
Taken on RRT call at 1000

## 2011-07-06 NOTE — Progress Notes (Addendum)
@ Subjective:  This order is a pleasant 65 year old female who was admitted last p.m. following 2 syncopal episodes. I was called this morning and she had a another syncopal episode while on telemetry. Review of her monitor shows asystole with 2 episodes of polymorphic ventricular tachycardia. She then resumed sinus rhythm. Note there was no preceding chest pain, palpitations, nausea, cough. Her husband witnessed the event. She now is without complaints.   Objective:  Filed Vitals:   07/06/11 0258 07/06/11 0453 07/06/11 0500 07/06/11 0949  BP: 137/66 132/64  142/66  Pulse: 79 79  63  Temp: 97.8 F (36.6 C) 98.3 F (36.8 C)  97.9 F (36.6 C)  TempSrc: Oral Oral  Oral  Resp: 20 20  22   Height:   5\' 4"  (1.626 m)   Weight:  146 lb (66.225 kg)    SpO2: 100% 95%  92%    Intake/Output from previous day:  Intake/Output Summary (Last 24 hours) at 07/06/11 1052 Last data filed at 07/06/11 0533  Gross per 24 hour  Intake      0 ml  Output      0 ml  Net      0 ml    Physical Exam: Physical exam: Well-developed well-nourished in no acute distress.  Skin is warm and dry.  HEENT is normal.  Neck is supple. No thyromegaly.  Chest is clear to auscultation with normal expansion.  Cardiovascular exam is regular rate and rhythm.  Abdominal exam nontender or distended. No masses palpated. Extremities show no edema. neuro grossly intact    Lab Results: Basic Metabolic Panel:  [facility] 07/05/11 2253  NA 138  K 3.5  CL 102  CO2 26  GLUCOSE 144*  BUN 19  CREATININE 0.56  CALCIUM 8.9  MG --  PHOS --   CBC:  Basename 07/06/11 0936 07/05/11 2253  WBC 9.3 8.3  NEUTROABS -- 6.4  HGB 12.1 12.6  HCT 35.9* 37.3  MCV 89.5 89.4  PLT 267 265   Cardiac Enzymes:  Basename 07/06/11 0247 07/05/11 2253  CKTOTAL 92 92  CKMB 3.2 3.3  CKMBINDEX -- --  TROPONINI <0.30 <0.30      Assessment/Plan:  #1-syncope-the patient had a syncopal event while on telemetry which demonstrated  bradycardia followed by complete asystole. She has no cardiac history nor does she have cardiac symptoms. Her baseline electrocardiogram shows no conduction abnormalities. We did repeat her electrocardiogram demonstrates sinus rhythm at a rate of 70 with a normal axis. Her intervals are normal. Her QT is not prolonged. I am uncertain as to why she is having are present the events. We will transfer her to the CCU. She will need a temporary pacemaker. Until that is in place a transcutaneous pacemaker will be available. We will plan an echocardiogram to quantify her LV function. I think ischemia is unlikely and her initial enzymes are negative. However she will most likely require cardiac catheterization to exclude RCA disease contributing to her bradycardia. I have talked with Dr. Ladona Ridgel and he is in agreement with the above. We will plan permanent pacemaker on Monday.  Note she and her husband have a home in [place]. Therefore Lyme disease is a consideration for conduction problems. However she does not have prolonged AV conduction but appears to have sinus arrest. Therefore I think this is unlikely. Check electrolytes. 45 minutes critical care time Olga Millers 07/06/2011, 10:52 AM   Discussed with Dr Eldridge Dace; will proceed with diagnostic cath while patient in lab for temporary  pacemaker Olga Millers

## 2011-07-06 NOTE — ED Notes (Signed)
Patient transported to CT 

## 2011-07-06 NOTE — ED Notes (Signed)
[facility] in with patient/spouse for admit.

## 2011-07-07 LAB — CBC
MCHC: 33 g/dL (ref 30.0–36.0)
RDW: 13 % (ref 11.5–15.5)
WBC: 10 10*3/uL (ref 4.0–10.5)

## 2011-07-07 LAB — MAGNESIUM: Magnesium: 2 mg/dL (ref 1.5–2.5)

## 2011-07-07 LAB — BASIC METABOLIC PANEL
GFR calc Af Amer: >90 mL/min (ref >90)
GFR calc non Af Amer: >90 mL/min (ref >90)
Potassium: 3.7 mEq/L (ref 3.5–5.1)
Sodium: 142 mEq/L (ref 135–145)

## 2011-07-07 MED ORDER — ROSUVASTATIN CALCIUM 20 MG PO TABS
20.0000 mg | ORAL_TABLET | Freq: Every day | ORAL | Status: DC
  Administered 2011-07-07 – 2011-07-08 (×2): 20 mg via ORAL
  Filled 2011-07-07 (×2): qty 1

## 2011-07-07 MED ORDER — ALPRAZOLAM 0.25 MG PO TABS
0.2500 mg | ORAL_TABLET | Freq: Once | ORAL | Status: AC
  Administered 2011-07-07: 0.25 mg via ORAL
  Filled 2011-07-07: qty 1

## 2011-07-07 MED ORDER — POTASSIUM CHLORIDE CRYS ER 20 MEQ PO TBCR
40.0000 meq | EXTENDED_RELEASE_TABLET | Freq: Once | ORAL | Status: AC
  Administered 2011-07-07: 40 meq via ORAL
  Filled 2011-07-07: qty 2

## 2011-07-07 NOTE — Progress Notes (Signed)
Pt requiring safety sitter at bedside to remind pt not to bend right leg and not get out of bed.  saftety sitter at bedside from 2130 until 0300.  Not available after 0300.  RN sitting just outside room and monitoring closely.

## 2011-07-07 NOTE — Progress Notes (Signed)
@   Subjective:  Denies CP or dyspnea; mildly confused   Objective:  Filed Vitals:   07/07/11 0330 07/07/11 0400 07/07/11 0500 07/07/11 0600  BP:  118/68 114/50 107/53  Pulse:  72 69 63  Temp: 98.4 F (36.9 C)     TempSrc: Oral     Resp:  14 17 14   Height:      Weight:      SpO2:  97% 96% 95%    Intake/Output from previous day:  Intake/Output Summary (Last 24 hours) at 07/07/11 0727 Last data filed at 07/07/11 0600  Gross per 24 hour  Intake 2557.33 ml  Output   1980 ml  Net 577.33 ml    Physical Exam: Physical exam: Well-developed well-nourished in no acute distress.  Skin is warm and dry.  HEENT is normal.  Neck is supple. No thyromegaly.  Chest is clear to auscultation with normal expansion.  Cardiovascular exam is regular rate and rhythm.  Abdominal exam nontender or distended. No masses palpated. Right groin with no hematoma and no bruit, pacemaker in place. Extremities show no edema. neuro mild confusion     Lab Results: Basic Metabolic Panel:  Basename 07/07/11 0518 07/06/11 1130  NA 142 139  K 3.7 3.2*  CL 108 106  CO2 28 25  GLUCOSE 106* 119*  BUN 8 11  CREATININE 0.55 0.51  CALCIUM 8.5 8.4  MG 2.0 2.0  PHOS -- --   CBC:  Basename 07/07/11 0518 07/06/11 0936 07/05/11 2253  WBC 10.0 9.3 --  NEUTROABS -- -- 6.4  HGB 11.0* 12.1 --  HCT 33.3* 35.9* --  MCV 90.2 89.5 --  PLT 221 267 --   Cardiac Enzymes:  Basename 07/06/11 0936 07/06/11 0247 07/05/11 2253  CKTOTAL 86 92 92  CKMB 3.1 3.2 3.3  CKMBINDEX -- -- --  TROPONINI <0.30 <0.30 <0.30     Assessment/Plan:  1) syncope - recurrent episode yesterday with asystole and  ventricular tachycardia on telemetry; patient with normal LV function; s/p PCI of Lcx (? Contribution from ischemia). However, presentation dramatic and I am not convinced this was ischemia mediated. Continue with temporary pacemaker. EP to see in AM for permanent pacemaker. NPO after midnight 2) CAD - s/p PCI; Continue  ASA and plavix; add statin. 3) confusion; ? Early dementia. Patient's husband stated yesterday her memory has been declining at home.  Deborah Anderson 07/07/2011, 7:27 AM

## 2011-07-07 NOTE — H&P (Signed)
RN called pt's husband to ask if he had noticed any short term memory challenges or forgetfulness exhibited by his wife in the recent weeks.  Dr. August Luz (pt's husband) did indicate that she has had some forgetfulness and short term memory mistakes that he has noticed.  I, Clydie Braun have noticed some "pleasant" confusion at times during my shift and wanted to see if this was the patient's baseline.  Her husband said that she has sometimes been "like thatAdvertising copywriter at bedside.   Pt is reassured frequently of her safety and supported emotionally  In any way needed.  Pt is oriented x 4, but I am noticiing that she is forgetting something that I told her just five minutes earlier.  Will monitor closely.  Dr. Isabel Caprice aware.

## 2011-07-07 NOTE — Progress Notes (Signed)
Pt woke up after sleeping about 3 hrs with a back ache and quiet restless and anxious.  Warm pack to back, tylenol, repositioned and cardiology notified.  Pt requested another xanax.  Orders receievd.  Pt keeps questioning what happened.   Pt reassured.

## 2011-07-08 ENCOUNTER — Encounter (HOSPITAL_COMMUNITY)
Admission: EM | Disposition: A | Discharge status: Home / Self Care | Payer: Self-pay | Source: Home / Self Care | Attending: Internal Medicine

## 2011-07-08 DIAGNOSIS — R55 Syncope and collapse: Secondary | ICD-10-CM

## 2011-07-08 DIAGNOSIS — I472 Ventricular tachycardia: Secondary | ICD-10-CM

## 2011-07-08 DIAGNOSIS — I498 Other specified cardiac arrhythmias: Secondary | ICD-10-CM

## 2011-07-08 DIAGNOSIS — I455 Other specified heart block: Secondary | ICD-10-CM

## 2011-07-08 HISTORY — PX: PERMANENT PACEMAKER INSERTION: SHX5480

## 2011-07-08 LAB — CBC
HCT: 33.5 % — ABNORMAL LOW (ref 36.0–46.0)
MCH: 30.2 pg (ref 26.0–34.0)
MCV: 90.3 fL (ref 78.0–100.0)
Platelets: 226 10*3/uL (ref 150–400)
RDW: 12.7 % (ref 11.5–15.5)
WBC: 9.4 10*3/uL (ref 4.0–10.5)

## 2011-07-08 LAB — BASIC METABOLIC PANEL
BUN: 11 mg/dL (ref 6–23)
CO2: 28 mEq/L (ref 19–32)
Calcium: 8.9 mg/dL (ref 8.4–10.5)
Glucose, Bld: 101 mg/dL — ABNORMAL HIGH (ref 70–99)
Sodium: 140 mEq/L (ref 135–145)

## 2011-07-08 LAB — POCT ACTIVATED CLOTTING TIME: Activated Clotting Time: 287 seconds

## 2011-07-08 SURGERY — PERMANENT PACEMAKER INSERTION
Anesthesia: LOCAL

## 2011-07-08 MED ORDER — ROSUVASTATIN CALCIUM 20 MG PO TABS
20.0000 mg | ORAL_TABLET | Freq: Every day | ORAL | Status: DC
  Administered 2011-07-09: 20 mg via ORAL
  Filled 2011-07-08 (×2): qty 1

## 2011-07-08 MED ORDER — ACETAMINOPHEN 325 MG PO TABS: 325.0000 mg | ORAL_TABLET | ORAL | Status: DC | PRN

## 2011-07-08 MED ORDER — ALPRAZOLAM 0.5 MG PO TABS
0.5000 mg | ORAL_TABLET | Freq: Every evening | ORAL | Status: DC | PRN
  Administered 2011-07-08: 0.5 mg via ORAL
  Filled 2011-07-08: qty 1

## 2011-07-08 MED ORDER — SODIUM CHLORIDE 0.9 % IJ SOLN: 3.0000 mL | INTRAMUSCULAR | Status: DC | PRN

## 2011-07-08 MED ORDER — SODIUM CHLORIDE 0.9 % IV SOLN: 250.0000 mL | INTRAVENOUS | Status: DC | PRN

## 2011-07-08 MED ORDER — FENTANYL CITRATE 0.05 MG/ML IJ SOLN
INTRAMUSCULAR | Status: AC
  Filled 2011-07-08: qty 2

## 2011-07-08 MED ORDER — CLOPIDOGREL BISULFATE 75 MG PO TABS
75.0000 mg | ORAL_TABLET | Freq: Every day | ORAL | Status: DC
  Administered 2011-07-09: 75 mg via ORAL
  Filled 2011-07-08: qty 1

## 2011-07-08 MED ORDER — SODIUM CHLORIDE 0.9 % IV SOLN: INTRAVENOUS | Status: DC

## 2011-07-08 MED ORDER — LIDOCAINE HCL (PF) 1 % IJ SOLN
INTRAMUSCULAR | Status: AC
  Filled 2011-07-08: qty 60

## 2011-07-08 MED ORDER — SODIUM CHLORIDE 0.9 % IR SOLN
80.0000 mg | Status: DC
  Filled 2011-07-08 (×5): qty 2

## 2011-07-08 MED ORDER — HEPARIN (PORCINE) IN NACL 2-0.9 UNIT/ML-% IJ SOLN
INTRAMUSCULAR | Status: AC
  Filled 2011-07-08: qty 1000

## 2011-07-08 MED ORDER — MIDAZOLAM HCL 5 MG/5ML IJ SOLN
INTRAMUSCULAR | Status: AC
  Filled 2011-07-08: qty 5

## 2011-07-08 MED ORDER — CEFAZOLIN SODIUM 1-5 GM-% IV SOLN
1.0000 g | Freq: Four times a day (QID) | INTRAVENOUS | Status: AC
  Administered 2011-07-08 – 2011-07-09 (×3): 1 g via INTRAVENOUS
  Filled 2011-07-08 (×3): qty 50

## 2011-07-08 MED ORDER — SODIUM CHLORIDE 0.9 % IV SOLN: INTRAVENOUS | Status: AC

## 2011-07-08 MED ORDER — ACETAMINOPHEN 500 MG PO TABS
500.0000 mg | ORAL_TABLET | Freq: Four times a day (QID) | ORAL | Status: DC
  Filled 2011-07-08 (×3): qty 1

## 2011-07-08 MED ORDER — CEFAZOLIN SODIUM 1-5 GM-% IV SOLN
INTRAVENOUS | Status: AC
  Filled 2011-07-08: qty 50

## 2011-07-08 MED ORDER — ONDANSETRON HCL 4 MG/2ML IJ SOLN
4.0000 mg | Freq: Four times a day (QID) | INTRAMUSCULAR | Status: DC | PRN
  Administered 2011-07-09: 4 mg via INTRAVENOUS
  Filled 2011-07-08: qty 2

## 2011-07-08 MED ORDER — ONDANSETRON HCL 4 MG/2ML IJ SOLN: 4.0000 mg | Freq: Four times a day (QID) | INTRAMUSCULAR | Status: DC | PRN

## 2011-07-08 MED ORDER — SODIUM CHLORIDE 0.9 % IJ SOLN: 3.0000 mL | Freq: Two times a day (BID) | INTRAMUSCULAR | Status: DC

## 2011-07-08 MED ORDER — SODIUM CHLORIDE 0.45 % IV SOLN: INTRAVENOUS | Status: DC

## 2011-07-08 MED ORDER — CHLORHEXIDINE GLUCONATE 4 % EX LIQD: 60.0000 mL | Freq: Once | CUTANEOUS | Status: DC

## 2011-07-08 MED ORDER — DIAZEPAM 5 MG PO TABS: 5.0000 mg | ORAL_TABLET | ORAL | Status: DC

## 2011-07-08 MED ORDER — CEFAZOLIN SODIUM 1-5 GM-% IV SOLN: 1.0000 g | INTRAVENOUS | Status: DC

## 2011-07-08 MED ORDER — DOXYCYCLINE HYCLATE 100 MG PO TABS
100.0000 mg | ORAL_TABLET | Freq: Two times a day (BID) | ORAL | Status: DC
  Administered 2011-07-08 – 2011-07-09 (×2): 100 mg via ORAL
  Filled 2011-07-08 (×3): qty 1

## 2011-07-08 MED ORDER — ASPIRIN 81 MG PO CHEW
81.0000 mg | CHEWABLE_TABLET | Freq: Every day | ORAL | Status: DC
  Administered 2011-07-09: 81 mg via ORAL
  Filled 2011-07-08: qty 1

## 2011-07-08 MED ORDER — MORPHINE SULFATE 4 MG/ML IJ SOLN
INTRAMUSCULAR | Status: AC
  Filled 2011-07-08: qty 1

## 2011-07-08 MED ORDER — MORPHINE SULFATE 4 MG/ML IJ SOLN
2.0000 mg | Freq: Once | INTRAMUSCULAR | Status: AC
  Administered 2011-07-08: 2 mg via INTRAVENOUS

## 2011-07-08 NOTE — Progress Notes (Signed)
@   Subjective:  Denies CP or dyspnea; complains of back pain   Objective:  Filed Vitals:   07/08/11 0300 07/08/11 0400 07/08/11 0500 07/08/11 0600  BP:  110/58 127/77 120/68  Pulse:      Temp:  97.9 F (36.6 C)    TempSrc:  Oral    Resp: 13 14 16 15   Height:      Weight:   152 lb 1.9 oz (69 kg)   SpO2:        Intake/Output from previous day:  Intake/Output Summary (Last 24 hours) at 07/08/11 0824 Last data filed at 07/08/11 0600  Gross per 24 hour  Intake    660 ml  Output   2425 ml  Net  -1765 ml    Physical Exam: Physical exam: Well-developed well-nourished in no acute distress.  Skin is warm and dry.  HEENT is normal.  Neck is supple. No thyromegaly.  Chest is clear to auscultation with normal expansion.  Cardiovascular exam is regular rate and rhythm.  Abdominal exam nontender or distended. No masses palpated. Right groin with no hematoma and no bruit, pacemaker in place. Extremities show no edema. neuro mild confusion     Lab Results: Basic Metabolic Panel:  Basename 07/08/11 0505 07/07/11 0518 07/06/11 1130  NA 140 142 --  K 4.3 3.7 --  CL 105 108 --  CO2 28 28 --  GLUCOSE 101* 106* --  BUN 11 8 --  CREATININE 0.62 0.55 --  CALCIUM 8.9 8.5 --  MG -- 2.0 2.0  PHOS -- -- --   CBC:  Basename 07/08/11 0505 07/07/11 0518 07/05/11 2253  WBC 9.4 10.0 --  NEUTROABS -- -- 6.4  HGB 11.2* 11.0* --  HCT 33.5* 33.3* --  MCV 90.3 90.2 --  PLT 226 221 --   Cardiac Enzymes:  Basename 07/06/11 0936 07/06/11 0247 07/05/11 2253  CKTOTAL 86 92 92  CKMB 3.1 3.2 3.3  CKMBINDEX -- -- --  TROPONINI <0.30 <0.30 <0.30     Assessment/Plan:  1) syncope - with documented asystole and  ventricular tachycardia on telemetry; patient with normal LV function; s/p PCI of Lcx (? Doubt contribution from ischemia).  Continue with temporary pacemaker. EP to see for permanent pacemaker. NPO. Would like to proceed with cardiac MR; however, I feel the risk of removing  temporary wire is too high. Check ACE level, TSH, free light chain assay, SPEP and UPEP. 2) CAD - s/p PCI; Continue ASA and plavix; continue statin.   Olga Millers 07/08/2011, 8:24 AM

## 2011-07-08 NOTE — Consult Note (Signed)
Deborah Anderson [id number] HPI: Deborah Anderson is an 65 y.o. femalereferred for consultation by Loretha Stapler, MD forsyncope   She is an otherwise healthy woman who presented with syncope on Friday. She had very little warning but some. She collapsed and awakened. She then had another episode of syncope later that evening with which her husband felt oh pulse and began CPR. EMS was called and she was brought to the hospital. While in the emergency room she had 2 other episodes which documentation is not available. Bradycardia is described and antecedent chest pain shortness of breath was noted by the husband. The patient herself has no recollection of that.  She was admitted to the floor. She then had a documented asystolic pause of about 20 seconds. He was followed by polymorphic ventricular tachycardia. Again the husband performed CPR. Again she is described as having increasing chest pain and shortness of breath.  Because of the polymorphic ventricular tachycardia she underwent catheterization which demonstrated a moderate circumflex lesion which was then stented. Review of these films demonstrated that the location of the lesion was quite distal in the obtuse marginal and was not related to the SA artery. She also undergone CAT scanning which was normal. Echocardiography was also undertaken which demonstrated normal left ventricular and normal right ventricular function.  The patient takes beta blocker eye drops. She is also spent the summer in an area endemic for Lyme disease. She has no known Lyme disease.    Disease.Past Medical History  Diagnosis Date  . Heart murmur   . Intraocular pressure increase   . Rhinitis, allergic     Past Surgical History  Procedure Date  . Appendectomy   . Tonsillectomy   . Abdominal hysterectomy   . Tonsillectomy   . Tubal ligation     Family History  Problem Relation Age of Onset  . Lung cancer Mother   . Diabetes Father   . Diabetes Sister     . Obesity Sister   . Kidney failure Sister     Social History:  reports that she quit smoking about 43 years ago. She has never used smokeless tobacco. She reports that she drinks about .6 ounces of alcohol per week. She reports that she does not use illicit drugs.  Allergies: No Known Allergies  Medications:  Results for orders placed during the hospital encounter of 07/05/11 (from the past 48 hour(s))  BASIC METABOLIC PANEL     Status: Abnormal   Collection Time   07/06/11 11:30 AM      Component Value Range Comment   Sodium 139  135 - 145 (mEq/L)    Potassium 3.2 (*) 3.5 - 5.1 (mEq/L)    Chloride 106  96 - 112 (mEq/L)    CO2 25  19 - 32 (mEq/L)    Glucose, Bld 119 (*) 70 - 99 (mg/dL)    BUN 11  6 - 23 (mg/dL)    Creatinine, Ser 4.09  0.50 - 1.10 (mg/dL)    Calcium 8.4  8.4 - 10.5 (mg/dL)    GFR calc non Af Amer >90  >90 (mL/min)    GFR calc Af Amer >90  >90 (mL/min)   MAGNESIUM     Status: Normal   Collection Time   07/06/11 11:30 AM      Component Value Range Comment   Magnesium 2.0  1.5 - 2.5 (mg/dL)   MRSA PCR SCREENING     Status: Normal   Collection Time   07/06/11  2:59  PM      Component Value Range Comment   MRSA by PCR NEGATIVE  NEGATIVE    CBC     Status: Abnormal   Collection Time   07/07/11  5:18 AM      Component Value Range Comment   WBC 10.0  4.0 - 10.5 (K/uL)    RBC 3.69 (*) 3.87 - 5.11 (MIL/uL)    Hemoglobin 11.0 (*) 12.0 - 15.0 (g/dL)    HCT 04.5 (*) 40.9 - 46.0 (%)    MCV 90.2  78.0 - 100.0 (fL)    MCH 29.8  26.0 - 34.0 (pg)    MCHC 33.0  30.0 - 36.0 (g/dL)    RDW 81.1  91.4 - 78.2 (%)    Platelets 221  150 - 400 (K/uL)   BASIC METABOLIC PANEL     Status: Abnormal   Collection Time   07/07/11  5:18 AM      Component Value Range Comment   Sodium 142  135 - 145 (mEq/L)    Potassium 3.7  3.5 - 5.1 (mEq/L)    Chloride 108  96 - 112 (mEq/L)    CO2 28  19 - 32 (mEq/L)    Glucose, Bld 106 (*) 70 - 99 (mg/dL)    BUN 8  6 - 23 (mg/dL)    Creatinine,  Ser 9.56  0.50 - 1.10 (mg/dL)    Calcium 8.5  8.4 - 10.5 (mg/dL)    GFR calc non Af Amer >90  >90 (mL/min)    GFR calc Af Amer >90  >90 (mL/min)   MAGNESIUM     Status: Normal   Collection Time   07/07/11  5:18 AM      Component Value Range Comment   Magnesium 2.0  1.5 - 2.5 (mg/dL)   CBC     Status: Abnormal   Collection Time   07/08/11  5:05 AM      Component Value Range Comment   WBC 9.4  4.0 - 10.5 (K/uL)    RBC 3.71 (*) 3.87 - 5.11 (MIL/uL)    Hemoglobin 11.2 (*) 12.0 - 15.0 (g/dL)    HCT 21.3 (*) 08.6 - 46.0 (%)    MCV 90.3  78.0 - 100.0 (fL)    MCH 30.2  26.0 - 34.0 (pg)    MCHC 33.4  30.0 - 36.0 (g/dL)    RDW 57.8  46.9 - 62.9 (%)    Platelets 226  150 - 400 (K/uL)   PROTIME-INR     Status: Normal   Collection Time   07/08/11  5:05 AM      Component Value Range Comment   Prothrombin Time 13.7  11.6 - 15.2 (seconds)    INR 1.03  0.00 - 1.49    BASIC METABOLIC PANEL     Status: Abnormal   Collection Time   07/08/11  5:05 AM      Component Value Range Comment   Sodium 140  135 - 145 (mEq/L)    Potassium 4.3  3.5 - 5.1 (mEq/L)    Chloride 105  96 - 112 (mEq/L)    CO2 28  19 - 32 (mEq/L)    Glucose, Bld 101 (*) 70 - 99 (mg/dL)    BUN 11  6 - 23 (mg/dL)    Creatinine, Ser 5.28  0.50 - 1.10 (mg/dL)    Calcium 8.9  8.4 - 10.5 (mg/dL)    GFR calc non Af Amer >90  >90 (mL/min)    GFR  calc Af Amer >90  >90 (mL/min)     No results found.  @ROS @Patient  reports no health concerns. General: no anorexia, weight gain or weight loss Cardiac: no chest pain, dyspnea, orthopnea, PND,  Syncope or palpitations HEENT normal, DM/Thyroid neg Cancer negative Respiratory: no COPD or hemoptysis GI: no nausea, abdominal pain, emesis, diarrhea or constipations Neurologic: No muscle weakness or paralysis; no speech disturbance; no headache ROS ow nef Blood pressure 118/58, pulse 78, temperature 97.9 F (36.6 C), temperature source Oral, resp. rate 15, height 5\' 4"  (1.626 m), weight 152  lb 1.9 oz (69 kg), SpO2 94.00%. General-Well-developed; no acute distress HEENT-[place]/AT; PERRL; EOM intact; conjunctiva and lids nl Neck-No JVD; no carotid bruits Endocrine-No thyromegaly Lungs-Clear lung fields; resonant percussion; normal I-to-E ratio Cardiovascular- normal PMI; normal S1 and S2 Abdomen-BS normal; soft and non-tender without masses or organomegaly Musculoskeletal-No deformities, cyanosis or clubbing Neurologic-Nl cranial nerves; symmetric strength and tone Skin- Warm, no significant lesions Extremities-Nl distal pulses; no edema There is an external pacemaker via her right thigh   Active Problems:  Sinus arrest; the patient has sinus arrest that seems to be associated with an antecedent chest pain shortness of breath symptom complex. This suggested is not primarily a rhythmic but may be secondary a neuraly mediated. However, it is not clear that this is the case. He does however impacted pacemaker choice we will use a rate drop response pacemaker implantation. We have discussed potential benefits as well as risks of pacemaker implantation including but not limited to death lead dislodgment and infection she understands these risks and is willing to proceed  Ventricular tachycardia, polymorphic: The patient has had isolated PVCs. They are the same morphology as her pacemaker I suspect that they're mechanical. She also polymorphic ventricular tachycardia in the context of her profound bradycardia. This would be presumed to be secondary.   Syncope and collapse  As above Assessment/Plan:   Sherryl Manges, MD 07/08/2011, 11:01 AM

## 2011-07-08 NOTE — Brief Op Note (Signed)
07/05/2011 - 07/08/2011  3:48 PM  PATIENT:  Deborah Anderson  65 y.o. female  PRE-OPERATIVE DIAGNOSIS:  bradicardia  POST-OPERATIVE DIAGNOSIS:  * No post-op diagnosis entered *  PROCEDURE:  Procedure(s): PERMANENT PACEMAKER INSERTION  SURGEON:  Surgeon(s): Duke Salvia, MD  PHYSICIAN ASSISTANT:   After routine prep and drape, lidocaine was infiltrated in the prepectoral subclavicular region on the left side an incision was made and carried down to later the prepectoral fascia using electrocautery and sharp dissection a pocket was formed similarly. Hemostasis was obtained.  After this, we turned our attention to gaining accessm to the extrathoracic,left subclavian vein. This was accomplished without difficulty and without the aspiration of air or puncture of the artery. 2 separate venipunctures were accomplished; guidewires were placed and retained and sequentially 7 French sheath were placed which were passed a [employer] Jude passive fixation ventricular lead, serial number [id number] and an opacification St. Jude serial number [id number] atrial lead.  The ventricular lead was manipulated to the right ventricular apex with a bipolar R wave was 5.8, the pacing impedance was 856, the threshold was 0.3 V at 0.5 ms .  Current at threshold was    .  The right atrial lead was manipulated to the right atrial appendage  with a bipolar P-wave was 3.2, the pacing impedance was 520, the threshold 0.9 V at 0.5 ms.  Current at threshold was 1.4 MA.  The leads were affixed to the prepectoral fascia and attached to a Medtronic Adapta L. serial number [id number] pulse generator.  Hemostasis was obtained. The pocket was copiously irrigated with antibiotic containing saline solution. The leads and the pulse generator were placed in the pocket and affixed to the prepectoral fascia. The wound is then closed in 3 layers in normal fashion. The wound is washed dried and a benzoin Steri-Strip this was applied the account  sponge counts and instrument counts were correct at the end of the procedure .Marland Kitchen The patient tolerated the procedure without apparent complication.  Gerlene Burdock.D.

## 2011-07-09 ENCOUNTER — Other Ambulatory Visit: Payer: Self-pay

## 2011-07-09 ENCOUNTER — Encounter (HOSPITAL_COMMUNITY): Payer: Self-pay | Admitting: Internal Medicine

## 2011-07-09 ENCOUNTER — Inpatient Hospital Stay (HOSPITAL_COMMUNITY): Payer: Medicare Other

## 2011-07-09 DIAGNOSIS — I2510 Atherosclerotic heart disease of native coronary artery without angina pectoris: Secondary | ICD-10-CM

## 2011-07-09 LAB — HEPATIC FUNCTION PANEL
AST: 19 U/L (ref 0–37)
Bilirubin, Direct: 0.1 mg/dL (ref 0.0–0.3)
Indirect Bilirubin: 0.4 mg/dL (ref 0.3–0.9)
Total Bilirubin: 0.5 mg/dL (ref 0.3–1.2)

## 2011-07-09 MED ORDER — ROSUVASTATIN CALCIUM 20 MG PO TABS: 20.0000 mg | ORAL_TABLET | Freq: Every day | ORAL | Status: DC

## 2011-07-09 MED ORDER — ISOSORBIDE MONONITRATE ER 30 MG PO TB24: 30.0000 mg | ORAL_TABLET | Freq: Every day | ORAL | Status: DC

## 2011-07-09 MED ORDER — CLOPIDOGREL BISULFATE 75 MG PO TABS: 75.0000 mg | ORAL_TABLET | Freq: Every day | ORAL | Status: DC

## 2011-07-09 MED ORDER — ASPIRIN 81 MG PO TABS: 81.0000 mg | ORAL_TABLET | Freq: Every day | ORAL | Status: AC

## 2011-07-09 MED ORDER — ALUM & MAG HYDROXIDE-SIMETH 200-200-20 MG/5ML PO SUSP
15.0000 mL | ORAL | Status: DC | PRN
  Administered 2011-07-09 (×2): 30 mL via ORAL
  Filled 2011-07-09 (×2): qty 30

## 2011-07-09 MED ORDER — DOXYCYCLINE HYCLATE 100 MG PO TABS: 100.0000 mg | ORAL_TABLET | Freq: Two times a day (BID) | ORAL | Status: AC

## 2011-07-09 MED FILL — Dextrose Inj 5%: INTRAVENOUS | Qty: 50 | Status: AC

## 2011-07-09 NOTE — Progress Notes (Signed)
CARDIAC REHAB PHASE I   PRE:  Rate/Rhythm: 77 SR    BP: sitting 127/65    SaO2:   MODE:  Ambulation: 680 ft   POST:  Rate/Rhythm: 108 ST, POD    BP: sitting 123/62     SaO2:   Pt ambulated well without issues. Seems overwhelmed. Did not like discussing her event and heart disease. Ed completed with husband present. Requests referral be sent to [facility]. [phone]  Harriet Masson CES, ACSM

## 2011-07-09 NOTE — Progress Notes (Addendum)
Patient Name: Deborah Anderson      SUBJECTIVE:   Past Medical History  Diagnosis Date  . Heart murmur   . Intraocular pressure increase   . Rhinitis, allergic   . Coronary artery disease s/p CX DES 12/12 07/09/2011    PHYSICAL EXAM Filed Vitals:   07/08/11 2300 07/09/11 0450 07/09/11 0500 07/09/11 0700  BP: 133/70 123/58  127/65  Pulse:  71  86  Temp: 98.4 F (36.9 C) 98 F (36.7 C)  98.1 F (36.7 C)  TempSrc: Oral Oral  Oral  Resp: 18     Height:      Weight:   147 lb 4.3 oz (66.8 kg)   SpO2:  97%  97%    General appearance: alert and no distress Lungs: clear to auscultation bilaterally Heart: regular rate and rhythm, S1, S2 normal, no murmur, click, rub or gallop Extremities: extremities normal, atraumatic, no cyanosis or edema Neurologic: Alert and oriented X 3, normal strength and tone. Normal symmetric reflexes. Normal coordination and gait  TELEMETRY: Reviewed telemetry pt in p synchronous pacing:    Intake/Output Summary (Last 24 hours) at 07/09/11 0822 Last data filed at 07/09/11 0510  Gross per 24 hour  Intake    470 ml  Output   1025 ml  Net   -555 ml    LABS: Basic Metabolic Panel:  Lab 07/08/11 4098 07/07/11 0518 07/06/11 1130 07/06/11 0936 07/05/11 2253  NA 140 142 139 142 138  K 4.3 3.7 3.2* 3.3* 3.5  CL 105 108 106 107 102  CO2 28 28 25 27 26   GLUCOSE 101* 106* 119* 117* 144*  BUN 11 8 11 12 19   CREATININE 0.62 0.55 0.51 0.62 0.56  CALCIUM 8.9 8.5 -- -- --  MG -- 2.0 2.0 -- --  PHOS -- -- -- -- --   Cardiac Enzymes:  Basename 07/06/11 0936  CKTOTAL 86  CKMB 3.1  CKMBINDEX --  TROPONINI <0.30   CBC:  Lab 07/08/11 0505 07/07/11 0518 07/06/11 0936 07/05/11 2253  WBC 9.4 10.0 9.3 8.3  NEUTROABS -- -- -- 6.4  HGB 11.2* 11.0* 12.1 12.6  HCT 33.5* 33.3* 35.9* 37.3  MCV 90.3 90.2 89.5 89.4  PLT 226 221 267 265     Basename 07/08/11 0505  TSH 4.221  T4TOTAL --  T3FREE --  THYROIDAB --   Anemia Panel: No results found for  this basename: VITAMINB12,FOLATE,FERRITIN,TIBC,IRON,RETICCTPCT in the last 72 hours   Device Interrogation:   ASSESSMENT AND PLAN:  Patient Active Hospital Problem List: Sinus arrest (07/08/2011)   Assessment: s/p pacer--no clear explanation ; with some confusion, discussion with ID suggested Rx with doxycycline for 4 wks 100 bid;  titires sent;; with chest pain syndrome, would add imdur 30 mg for at least 4 weeks  Defer duration to [facility]   Plan: out pt thallium scan--resting Ventricular tachycardia, 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

## 2011-07-09 NOTE — Discharge Summary (Signed)
S/p pacer for heart block   Lyme titres neg outpt eveal pending per dr Jens Som On DPT for DES to Cx

## 2011-07-09 NOTE — Discharge Summary (Signed)
Discharge Summary   Patient ID: YARED SUSAN MRN: [id number], DOB/AGE: March 15, 1946 65 y.o. Admit date: 07/05/2011 D/C date:     07/09/2011   Primary Discharge Diagnoses:  1. Syncope with documented asystole followed by polymorphic VT on telemetry - s/p Medtronic PPM implantation 07/08/11 - polymorphic VT felt possibly secondary to profound bradycardia - cause of sinus arrest unclear, lyme titers pending  2. Confusion with unclear etiology - ?Related to lyme disease - titers pending - Negative head CT  3. Newly diagnosed CAD by cath s/p DES to LCX 07/06/11  - normal LV function by echo this admission  Secondary Discharge Diagnoses:  1. Allergic rhinitis  Hospital Course: 65 y/o F with hx mild MR presented to [facility] with syncope. The patient felt funny and dizzy, then fell and caught herself. Her husband who is a nephrologist noted ber SBP was 80s and she was orthostatic. Pulse was regular at the time. Her pulse was regular at the time. She increased the amount of fluids she drank and increased her salt intake and felt better. Then at 830 pm, she was sitting on the ground and slumped over. He didn't think he felt a pulse, so he started chest compressions. She was out for about 30 sec by his recollection. He noted some jerking movements and then she came to. She denied any CP. Because of these sx, she proceeded to the ER. Of note she just returned from a trip to [place] and was very active there. She also hikes with her husband in [place] where lyme is endemic. Pt reportedly had an episode of sinus brady to 40s in ED and became symptomatic with this, but it was short lived. CTA was negative for PE & CT head was negative. Her first episode sounded orthostatic in nature, but second episode happened w/o warning, concerning for arrhythmic cause. EKG with no acute ischemic changes and no arrhythmia. She was admitted to the hospital for further evaluation. Cardiac enzymes remained negative. TSH was  normal.  The following day, the patient was noted to have recurrent syncope and review of her monitor showed asystole with 2 episodes of polymorphic ventricular tachycardia. Her QT was not prolonged. She was transferred to CCU with plans to proceed with PPM. She had a temporary ppm placed on 07/06/11, along with LHC demonstrating newly diagnosed CAD and 80% mid Cx lesion which was subsequently stented with a DES. She was started on dual anti-platelet therapy. 2D echo had normal LV function. She also exhibited episodes of confusion of which the etiology was unclear. Dr. Jens Som raised the question of early dementia, as her memory had been declining at home. Dr. Graciela Husbands saw the patient the following day for EP consultation. He suggested that her arrythmia may be secondarily neurally mediated, although this was not necesssarily clear. In regards to her VT, Dr. Graciela Husbands felt that they were secondary in the setting of her profound bradycardia. He recommended proceeding with rate drop response ppm implantation which was performed 07/08/11. Today, the patient is feeling well. She still has some confusion. Dr. Graciela Husbands discussed the case with Dr. Maurice March with ID, who suggested rx with doxycycline x4 weeks at 100mg  po bid. Imdur was added - Dr. Graciela Husbands recommended for at least 4 weeks with ultimate deferring of duration to Dr. Jens Som. He also recommended an outpatient resting thalium scan and we have put in a request to our office to assist with this. Dr. Graciela Husbands has seen & examined the patient today and feels she is  stable for discharge.  Discharge Vitals: Blood pressure 127/65, pulse 86, temperature 98.1 F (36.7 C), temperature source Oral, resp. rate 18, height 5\' 4"  (1.626 m), weight 147 lb 4.3 oz (66.8 kg), SpO2 97.00%.  Labs: Lab Results  Component Value Date   WBC 9.4 07/08/2011   HGB 11.2* 07/08/2011   HCT 33.5* 07/08/2011   MCV 90.3 07/08/2011   PLT 226 07/08/2011    Lab 07/08/11 0505  NA 140  K 4.3  CL 105   CO2 28  BUN 11  CREATININE 0.62  CALCIUM 8.9  PROT --  BILITOT --  ALKPHOS --  ALT --  AST --  GLUCOSE 101*    Basename 07/06/11 0936  CKTOTAL 86  CKMB 3.1  TROPONINI <0.30    Diagnostic Studies/Procedures:   1. Cardiac Cath 07/06/11  IMPRESSIONS:  1. Normal left main coronary artery. 2. Normal left anterior descending artery and its branches. 3. 80% mid circumflex lesion, that was successfully stented with a drug-eluting stent. 2.75 x 16 Promus element stent post dilated to greater than 3 mm in diameter. 4. Normal right coronary artery. 5. Normal left ventricular systolic function. LVEDP 14 mmHg. Ejection fraction 60 6. Successful transvenous pacemaker placement from the right femoral vein.  2. 2D echo 07/06/11  Left ventricle: The cavity size was normal. Wall thickness was not accurately measured as the image is off plane. I suspect the wall thickness is normal, however, a contrast study may help further evaluate this. Systolic function was normal. The estimated ejection fraction was in the range of 55% to 60%. Left ventricular diastolic function parameters were normal, e' velocities were >10 cm/sec. - Left atrium: The atrium was normal in size. - Right atrium: The atrium was mildly dilated. - Tricuspid valve: Mild regurgitation. - Pulmonary arteries: PA peak pressure: 40mm Hg (S). - Line: A venous catheter was visualized in the inferior vena cava, with its tip in the right atrium. No abnormal features noted. - Inferior vena cava: The vessel was normal in size; the respirophasic diameter changes were in the normal range (= 50%); findings are consistent with normal central venous pressure.  3 . Chest 2V 07/09/2011  *RADIOLOGY REPORT*  Clinical Data: Pacemaker placement  CHEST - 2 VIEW  Comparison: 01/04/2011  Findings: Left subclavian dual lead pacemaker has been placed. Tips of the leads are in the right atrium and right ventricle.  No pneumothorax.  Clear lungs.  No pleural  effusion.  Normal heart size.  IMPRESSION: Left subclavian dual lead pacemaker placement without complication.    4. Ct Head Wo Contrast 07/06/2011  *RADIOLOGY REPORT*  Clinical Data: Syncopal episode x2 today.  Recent overseas trip to [place].  CT HEAD WITHOUT CONTRAST 07/06/2011:  Technique:  Contiguous axial images were obtained from the base of the skull through the vertex without contrast.  Comparison: None.  Findings: Ventricular system normal in size and appearance for age. No mass lesion.  No midline shift.  No acute hemorrhage or hematoma.  No extra-axial fluid collections.  No evidence of acute infarction.  No focal brain parenchymal abnormalities.  No focal osseous abnormalities involving the skull.  Visualized paranasal sinuses, mastoid air cells, and middle ear cavities well- aerated.  IMPRESSION: Normal examination.   5. Ct Angio Chest W/cm &/or Wo Cm 07/06/2011  *RADIOLOGY REPORT*  Clinical Data:  Syncopal episode x2 today.  Recent overseas trip to [place].  CT ANGIOGRAPHY CHEST WITH CONTRAST 07/06/2011:  Technique:  Multidetector CT imaging of the chest was performed  using the standard protocol during bolus administration of intravenous contrast.  Multiplanar CT image reconstructions including MIPs were obtained to evaluate the vascular anatomy.  Contrast: OMNIPAQUE IOHEXOL 350 MG/ML IV.  Comparison:  No prior CT.  Two-view chest x-ray [facility] 01/04/2011, 10/12/2004, 02/02/2004.  Findings:  Contrast opacification of the pulmonary arteries is good.  No filling defects within either main pulmonary artery or their branches in either lung to suggest pulmonary embolism.  Heart size upper normal with mild LAD coronary artery calcification. Mild atherosclerosis involving the thoracic and upper abdominal aorta without aneurysm or dissection.  No pericardial effusion.  Pulmonary parenchyma clear without localized airspace consolidation, interstitial disease, or parenchymal nodules or  masses.  No pleural effusions.  Central airways patent.  No significant mediastinal, hilar, or axillary lymphadenopathy. Thyroid gland unremarkable.  Visualized upper abdomen unremarkable for the early arterial phase of enhancement with the exception of non-obstructing calcified plaques at the origins of the renal arteries.  Bone window images unremarkable.  Review of the MIP images confirms the above findings.  IMPRESSION:  1.  No evidence of pulmonary embolism. 2.  No acute cardiopulmonary disease.   Discharge Medications   Current Discharge Medication List    START taking these medications   Details  clopidogrel (PLAVIX) 75 MG tablet Take 1 tablet (75 mg total) by mouth daily with breakfast. Qty: 30 tablet, Refills: 6    doxycycline (VIBRA-TABS) 100 MG tablet Take 1 tablet (100 mg total) by mouth every 12 (twelve) hours. Qty: 60 tablet, Refills: 0    isosorbide mononitrate (IMDUR) 30 MG 24 hr tablet Take 1 tablet (30 mg total) by mouth daily. Qty: 30 tablet, Refills: 2    rosuvastatin (CRESTOR) 20 MG tablet Take 1 tablet (20 mg total) by mouth at bedtime. Qty: 30 tablet, Refills: 6      CONTINUE these medications which have CHANGED   Details  aspirin 81 MG tablet Take 1 tablet (81 mg total) by mouth daily.      CONTINUE these medications which have NOT CHANGED   Details  ALPRAZolam (XANAX) 0.5 MG tablet Take 0.5 mg by mouth at bedtime as needed. For sleep    Melatonin 3 MG CAPS Take 1 capsule by mouth at bedtime as needed. For sleep     Multiple Vitamin (MULTIVITAMIN) tablet Take 1 tablet by mouth daily.      timolol (BETIMOL) 0.25 % ophthalmic solution Place 1 drop into both eyes every morning.          Disposition   The patient will be discharged in stable condition to home. No driving until cleared by MD at follow-up appt. Pt was given stick-figure d/c sheet regarding activity/woundcare/bathing. Discharge Orders    Future Appointments: Provider: Department: Dept  Phone: Center:   07/18/2011 12:00 PM Vella Kohler [facility] [phone] [facility]   08/01/2011 8:40 AM [facility] [phone] [facility]     Future Orders Please Complete By Expires   Diet - low sodium heart healthy      Increase activity slowly      Comments:   See attached sheet for wound care, activity instructions   Discharge instructions      Comments:   You have a few outstanding lab tests including UPEP (urine electropheresis) and Lyme Titers ordered by our doctors. You can call Dr. Ludwig Clarks office in a few days to see if results are available.     Follow-up Information    Follow up with  Olga Millers, MD. (Our office will call to schedule appointments)    Contact information:   [address] [place], [address] [place] [address] [phone]       Follow up with Selena Batten. (07/18/11 at noon for wound check)    Contact information:   [address] [place] [address]            Outstanding Lab Tests - UPEP, Lyme Titers. We will also order CMET prior to DC to establish a baseline given initiation of Crestor. Her husband has been informed of these outstanding lab tests.  Duration of Discharge Encounter: Greater than 30 minutes including physician and PA time.  Signed, Ronie Spies PA-C 07/09/2011, 9:26 AM

## 2011-07-10 LAB — UIFE/LIGHT CHAINS/TP QN, 24-HR UR
Albumin, U: DETECTED
Alpha 1, Urine: DETECTED — AB
Free Lambda Lt Chains,Ur: 0.18 mg/dL (ref 0.02–0.67)

## 2011-07-10 NOTE — Progress Notes (Signed)
Deborah Anderson should followup with Dr. Jens Som and I will follow at a distance as her across the street neighbor.

## 2011-07-12 LAB — B. BURGDORFI ANTIBODIES BY WB
B burgdorferi IgG Abs (IB): NEGATIVE
B burgdorferi IgM Abs (IB): NEGATIVE

## 2011-07-18 ENCOUNTER — Encounter: Payer: Self-pay | Admitting: Internal Medicine

## 2011-07-18 ENCOUNTER — Ambulatory Visit (INDEPENDENT_AMBULATORY_CARE_PROVIDER_SITE_OTHER): Payer: Medicare Other | Admitting: *Deleted

## 2011-07-18 DIAGNOSIS — I455 Other specified heart block: Secondary | ICD-10-CM

## 2011-07-18 LAB — PACEMAKER DEVICE OBSERVATION
AL AMPLITUDE: 4 mv
AL IMPEDENCE PM: 472 Ohm
AL THRESHOLD: 0.75 V
ATRIAL PACING PM: 1
RV LEAD IMPEDENCE PM: 731 Ohm

## 2011-07-18 NOTE — Progress Notes (Signed)
Wound check-PPM 

## 2011-07-29 ENCOUNTER — Encounter: Payer: Medicare Other | Admitting: Physician Assistant

## 2011-08-01 ENCOUNTER — Ambulatory Visit
Admission: RE | Admit: 2011-08-01 | Discharge: 2011-08-01 | Disposition: A | Discharge status: Home / Self Care | Payer: Medicare Other | Source: Ambulatory Visit | Attending: Geriatric Medicine | Admitting: Geriatric Medicine

## 2011-08-01 DIAGNOSIS — Z1231 Encounter for screening mammogram for malignant neoplasm of breast: Secondary | ICD-10-CM | POA: Diagnosis not present

## 2011-08-02 ENCOUNTER — Ambulatory Visit (INDEPENDENT_AMBULATORY_CARE_PROVIDER_SITE_OTHER): Payer: Medicare Other | Admitting: Cardiology

## 2011-08-02 ENCOUNTER — Encounter: Payer: Self-pay | Admitting: Cardiology

## 2011-08-02 VITALS — BP 138/54 | HR 86 | Ht 64.0 in | Wt 147.0 lb

## 2011-08-02 DIAGNOSIS — I469 Cardiac arrest, cause unspecified: Secondary | ICD-10-CM

## 2011-08-02 DIAGNOSIS — E7800 Pure hypercholesterolemia, unspecified: Secondary | ICD-10-CM

## 2011-08-02 DIAGNOSIS — I2510 Atherosclerotic heart disease of native coronary artery without angina pectoris: Secondary | ICD-10-CM | POA: Diagnosis not present

## 2011-08-02 DIAGNOSIS — R55 Syncope and collapse: Secondary | ICD-10-CM

## 2011-08-02 DIAGNOSIS — Z950 Presence of cardiac pacemaker: Secondary | ICD-10-CM | POA: Insufficient documentation

## 2011-08-02 NOTE — Assessment & Plan Note (Signed)
Plan continue aspirin, Plavix and statin. Discontinue imdur. Check lipids and liver in 6 weeks.

## 2011-08-02 NOTE — Patient Instructions (Addendum)
Your physician wants you to follow-up in: 6 months You will receive a reminder letter in the mail two months in advance. If you don't receive a letter, please call our office to schedule the follow-up appointment.   Your physician recommends that you return for lab work in: 2 weeks

## 2011-08-02 NOTE — Assessment & Plan Note (Addendum)
Etiology of the events remains unclear. Her LV function is normal. She did have circumflex disease but I do not think this caused her cardiac arrhythmia. Chest CT showed no pulmonary embolus. Followup electrocardiogram was normal. 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 I will check ferritin although I think hemachromatosis is unlikely given normal hemoglobin. We will check SPEP although UPEP DID not suggest amyloidosis. I will check ACE level a chest CT did not show adenopathy suggestive of sarcoidosis. We cannot perform a cardiac MRI as she has a pacemaker. 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 Regardless her pacemaker is in place and she has had no further symptoms. >45 minutes spent with patient in office today

## 2011-08-02 NOTE — Progress Notes (Signed)
HPI: Pleasant female for followup of coronary artery disease. Patient recently admitted to [facility] following a syncopal episode. While on telemetry she was noted to have asystole followed by polymorphic ventricular tachycardia. Her husband who is a retired nephrologist perform CPR. Chest CT showed no pulmonary embolus. Echocardiogram in December of 2012 showed normal LV function and no significant valvular disease. Cardiac enzymes were negative. Cardiac catheterization revealed a 80% circumflex and EF 60%; she had PCI with a drug-eluting stent. She also had a pacemaker placed because of her asystole. Lyme titers negative. UPEP showed normal free Kappa and Lambda light chains with normal ratio; polyclonal increase in light chains noted.  TSH normal. Since discharge the patient denies any dyspnea on exertion, orthopnea, PND, pedal edema, palpitations, syncope or chest pain.   Current Outpatient Prescriptions  Medication Sig Dispense Refill  . ALPRAZolam (XANAX) 0.5 MG tablet Take 0.5 mg by mouth at bedtime as needed. For sleep      . aspirin 81 MG tablet Take 1 tablet (81 mg total) by mouth daily.      . clopidogrel (PLAVIX) 75 MG tablet Take 1 tablet (75 mg total) by mouth daily with breakfast.  30 tablet  6  . DOXYCYCLINE HYCLATE PO Take 1 tablet by mouth daily.        . isosorbide mononitrate (IMDUR) 30 MG 24 hr tablet Take 1 tablet (30 mg total) by mouth daily.  30 tablet  2  . Multiple Vitamin (MULTIVITAMIN) tablet Take 1 tablet by mouth daily.        . rosuvastatin (CRESTOR) 20 MG tablet Take 1 tablet (20 mg total) by mouth at bedtime.  30 tablet  6  . timolol (BETIMOL) 0.25 % ophthalmic solution Place 1 drop into both eyes every morning.           Past Medical History  Diagnosis Date  . Intraocular pressure increase   . Rhinitis, allergic   . CAD (coronary artery disease)     Newly diagnosed by cath s/p DES to LCX 07/06/11. Normal EF by echo 06/2011.  Marland Kitchen Syncope    Documented asystole followed by polymorphic VT on telemetry. s/p Medtronic PPM implantation 07/08/11. Cause of sinus arrest unclear, lyme titers pending     Past Surgical History  Procedure Date  . Appendectomy   . Tonsillectomy   . Abdominal hysterectomy   . Tonsillectomy   . Tubal ligation   . Pacemaker insertion     History   Social History  . Marital Status: Married    Spouse Name: N/A    Number of Children: N/A  . Years of Education: N/A   Occupational History  . Not on file.   Social History Main Topics  . Smoking status: Former Smoker    Quit date: 07/30/1967  . Smokeless tobacco: Never Used  . Alcohol Use: 0.6 oz/week    1 Glasses of wine per week     One glass of wine with dinner daily  . Drug Use: No  . Sexually Active: Not on file   Other Topics Concern  . Not on file   Social History Narrative  . No narrative on file    ROS: no fevers or chills, productive cough, hemoptysis, dysphasia, odynophagia, melena, hematochezia, dysuria, hematuria, rash, seizure activity, orthopnea, PND, pedal edema, claudication. Remaining systems are negative.  Physical Exam: Well-developed well-nourished in no acute distress.  Skin is warm and dry.  HEENT is normal.  Neck is supple. No thyromegaly.  Chest is clear to auscultation with normal expansion. Pacemaker site without evidence of infection Cardiovascular exam is regular rate and rhythm.  Abdominal exam nontender or distended. No masses palpated. Extremities show no edema. neuro grossly intact

## 2011-08-02 NOTE — Assessment & Plan Note (Signed)
Management per electrophysiology. 

## 2011-08-15 ENCOUNTER — Ambulatory Visit (INDEPENDENT_AMBULATORY_CARE_PROVIDER_SITE_OTHER): Payer: Medicare Other | Admitting: *Deleted

## 2011-08-15 ENCOUNTER — Encounter: Payer: Self-pay | Admitting: Internal Medicine

## 2011-08-15 DIAGNOSIS — R0989 Other specified symptoms and signs involving the circulatory and respiratory systems: Secondary | ICD-10-CM

## 2011-08-15 DIAGNOSIS — E7800 Pure hypercholesterolemia, unspecified: Secondary | ICD-10-CM

## 2011-08-15 DIAGNOSIS — I498 Other specified cardiac arrhythmias: Secondary | ICD-10-CM

## 2011-08-15 DIAGNOSIS — R55 Syncope and collapse: Secondary | ICD-10-CM | POA: Diagnosis not present

## 2011-08-15 DIAGNOSIS — I469 Cardiac arrest, cause unspecified: Secondary | ICD-10-CM

## 2011-08-15 DIAGNOSIS — R5383 Other fatigue: Secondary | ICD-10-CM | POA: Diagnosis not present

## 2011-08-15 DIAGNOSIS — R5381 Other malaise: Secondary | ICD-10-CM | POA: Diagnosis not present

## 2011-08-15 LAB — ANGIOTENSIN CONVERTING ENZYME: Angiotensin-Converting Enzyme: 23 U/L (ref 8–52)

## 2011-08-15 LAB — PACEMAKER DEVICE OBSERVATION

## 2011-08-15 NOTE — Progress Notes (Signed)
Pacer interrogation only--no charge

## 2011-08-16 ENCOUNTER — Other Ambulatory Visit: Payer: Medicare Other | Admitting: *Deleted

## 2011-08-16 LAB — BASIC METABOLIC PANEL
BUN: 23 mg/dL (ref 6–23)
Chloride: 102 mEq/L (ref 96–112)
Potassium: 4.2 mEq/L (ref 3.5–5.1)

## 2011-08-16 LAB — HEPATIC FUNCTION PANEL
ALT: 23 U/L (ref 0–35)
AST: 25 U/L (ref 0–37)
Albumin: 4.1 g/dL (ref 3.5–5.2)

## 2011-08-16 LAB — LIPID PANEL
HDL: 84.5 mg/dL (ref >39.00)
Triglycerides: 66 mg/dL (ref 0.0–149.0)

## 2011-08-16 LAB — SEDIMENTATION RATE: Sed Rate: 8 mm/hr (ref 0–22)

## 2011-08-19 ENCOUNTER — Telehealth: Payer: Self-pay | Admitting: Cardiology

## 2011-08-19 NOTE — Telephone Encounter (Signed)
Lab results given to pt.

## 2011-08-19 NOTE — Telephone Encounter (Signed)
New problem Patient calling about lab results

## 2011-08-20 LAB — PROTEIN ELECTROPHORESIS, SERUM
Albumin ELP: 58.9 % (ref 55.8–66.1)
Total Protein, Serum Electrophoresis: 6.9 g/dL (ref 6.0–8.3)

## 2011-08-26 ENCOUNTER — Telehealth: Payer: Self-pay | Admitting: Cardiology

## 2011-08-26 NOTE — Telephone Encounter (Addendum)
Pt's husband wants a call from [facility] re lab results

## 2011-08-26 NOTE — Telephone Encounter (Signed)
Dr Jens Som discussed labs with dr August Luz, copy of all pt labs placed at the front desk for his pick up

## 2011-08-26 NOTE — Telephone Encounter (Signed)
Follow up to previous call: Pt's husband dr August Luz calling back wanting a call asap re labs, said it was important, [phone]

## 2011-09-09 ENCOUNTER — Other Ambulatory Visit: Payer: Self-pay | Admitting: *Deleted

## 2011-09-09 MED ORDER — CLOPIDOGREL BISULFATE 75 MG PO TABS: 75.0000 mg | ORAL_TABLET | Freq: Every day | ORAL | Status: DC

## 2011-09-10 ENCOUNTER — Encounter: Payer: Self-pay | Admitting: Internal Medicine

## 2011-09-10 ENCOUNTER — Encounter: Payer: Medicare Other | Admitting: *Deleted

## 2011-09-10 ENCOUNTER — Telehealth: Payer: Self-pay | Admitting: *Deleted

## 2011-09-10 MED ORDER — ONE-DAILY MULTI VITAMINS PO TABS: 1.0000 | ORAL_TABLET | Freq: Every day | ORAL | Status: AC

## 2011-09-10 MED ORDER — ROSUVASTATIN CALCIUM 20 MG PO TABS: 20.0000 mg | ORAL_TABLET | Freq: Every day | ORAL | Status: DC

## 2011-09-10 NOTE — Telephone Encounter (Signed)
Sent 90 day supply to Pharmacy

## 2011-09-11 ENCOUNTER — Other Ambulatory Visit: Payer: Self-pay | Admitting: *Deleted

## 2011-09-11 MED ORDER — ROSUVASTATIN CALCIUM 20 MG PO TABS: 20.0000 mg | ORAL_TABLET | Freq: Every day | ORAL | Status: DC

## 2011-09-12 LAB — PACEMAKER DEVICE OBSERVATION
AL IMPEDENCE PM: 564 Ohm
AL THRESHOLD: 1 V
RV LEAD AMPLITUDE: 8 mv
RV LEAD IMPEDENCE PM: 640 Ohm

## 2011-09-18 ENCOUNTER — Ambulatory Visit (INDEPENDENT_AMBULATORY_CARE_PROVIDER_SITE_OTHER): Payer: Medicare Other | Admitting: Internal Medicine

## 2011-09-18 ENCOUNTER — Encounter: Payer: Self-pay | Admitting: Internal Medicine

## 2011-09-18 DIAGNOSIS — I455 Other specified heart block: Secondary | ICD-10-CM

## 2011-09-18 DIAGNOSIS — R55 Syncope and collapse: Secondary | ICD-10-CM

## 2011-09-18 DIAGNOSIS — Z950 Presence of cardiac pacemaker: Secondary | ICD-10-CM | POA: Diagnosis not present

## 2011-09-18 LAB — PACEMAKER DEVICE OBSERVATION
AL THRESHOLD: 1.125 V
RV LEAD AMPLITUDE: 11.2 mv

## 2011-09-18 NOTE — Assessment & Plan Note (Addendum)
Stable post pacing the issue of the table is the cause. In the context of her memory issues there is a concern about a systemic process. Dr. August Luz is asked me to consider referring her for neurological evaluation and asked that I speak with Dr.Love or HicklingI will do that

## 2011-09-18 NOTE — Progress Notes (Signed)
  HPI  Deborah Anderson is a 66 y.o. female Seen in followup for  asystole followed by polymorphic ventricular tachycardia requiring CPR. Chest CT showed no pulmonary embolus. Echocardiogram in December of 2012 showed normal LV function and no significant valvular disease. Cardiac enzymes were negative. Cardiac catheterization revealed a 80% circumflex and EF 60%; she had PCI with a drug-eluting stent. She also had a pacemaker placed because of her asystole. Lyme titers negative. UPEP showed normal free Kappa and Lambda light chains with normal ratio; polyclonal increase in light chains noted. TSH normal  She comes in today because of a couple of "spells" these happened about 2 weeks ago when she just didn't feel normal  They came for interrogation and nothting  Was identtified  She also had a spell in [place] where she felt lightheadedness   Her husband has also been concerned about memory and has spoken with colleagues about amyloid etd Past Medical History  Diagnosis Date  . Intraocular pressure increase   . Rhinitis, allergic   . CAD (coronary artery disease)     Newly diagnosed by cath s/p DES to LCX 07/06/11. Normal EF by echo 06/2011.  Marland Kitchen Syncope     Documented asystole followed by polymorphic VT on telemetry. s/p Medtronic PPM implantation 07/08/11. Cause of sinus arrest unclear, lyme titers pending     Past Surgical History  Procedure Date  . Appendectomy   . Tonsillectomy   . Abdominal hysterectomy   . Tonsillectomy   . Tubal ligation   . Pacemaker insertion     Current Outpatient Prescriptions  Medication Sig Dispense Refill  . ALPRAZolam (XANAX) 0.5 MG tablet Take 0.5 mg by mouth at bedtime as needed. For sleep      . aspirin 81 MG tablet Take 1 tablet (81 mg total) by mouth daily.      . clopidogrel (PLAVIX) 75 MG tablet Take 1 tablet (75 mg total) by mouth daily with breakfast.  90 tablet  3  . Multiple Vitamin (MULTIVITAMIN) tablet Take 1 tablet by mouth daily.  90 tablet  1    . rosuvastatin (CRESTOR) 20 MG tablet Take 1 tablet (20 mg total) by mouth daily.  90 tablet  2  . timolol (BETIMOL) 0.25 % ophthalmic solution Place 1 drop into both eyes every morning.          No Known Allergies  Review of Systems negative except from HPI and PMH  Physical Exam There were no vitals taken for this visit. Well developed and well nourished in no acute distress HENT normal E scleral and icterus clear Neck Supple Wound well healed Clear to ausculation Regular rate and rhythm, no murmurs gallops or rub Soft with active bowel sounds No clubbing cyanosis none Edema Alert and oriented, grossly normal motor and sensory function Skin Warm and Dry

## 2011-09-18 NOTE — Patient Instructions (Signed)
Your physician recommends that you schedule a follow-up appointment in: May 2013 with Dr. Jens Som.  Your physician wants you to follow-up in: December 2013 with Dr. Graciela Husbands. You will receive a reminder letter in the mail two months in advance. If you don't receive a letter, please call our office to schedule the follow-up appointment.  Your physician recommends that you continue on your current medications as directed. Please refer to the Current Medication list given to you today.

## 2011-09-18 NOTE — Assessment & Plan Note (Signed)
The patient's device was interrogated.  The information was reviewed. Changes were made in the programming.

## 2011-10-16 ENCOUNTER — Encounter: Payer: Medicare Other | Admitting: Internal Medicine

## 2011-10-17 DIAGNOSIS — H40059 Ocular hypertension, unspecified eye: Secondary | ICD-10-CM | POA: Diagnosis not present

## 2011-10-17 DIAGNOSIS — H18839 Recurrent erosion of cornea, unspecified eye: Secondary | ICD-10-CM | POA: Diagnosis not present

## 2011-10-28 DIAGNOSIS — R55 Syncope and collapse: Secondary | ICD-10-CM | POA: Diagnosis not present

## 2011-10-28 DIAGNOSIS — I469 Cardiac arrest, cause unspecified: Secondary | ICD-10-CM | POA: Diagnosis not present

## 2011-10-30 ENCOUNTER — Other Ambulatory Visit: Payer: Self-pay | Admitting: Neurology

## 2011-10-30 DIAGNOSIS — I469 Cardiac arrest, cause unspecified: Secondary | ICD-10-CM

## 2011-10-30 DIAGNOSIS — R413 Other amnesia: Secondary | ICD-10-CM

## 2011-10-31 ENCOUNTER — Ambulatory Visit
Admission: RE | Admit: 2011-10-31 | Discharge: 2011-10-31 | Disposition: A | Discharge status: Home / Self Care | Payer: Medicare Other | Source: Ambulatory Visit | Attending: Neurology | Admitting: Neurology

## 2011-10-31 ENCOUNTER — Other Ambulatory Visit: Payer: Self-pay | Admitting: Neurology

## 2011-10-31 DIAGNOSIS — I469 Cardiac arrest, cause unspecified: Secondary | ICD-10-CM | POA: Diagnosis not present

## 2011-10-31 DIAGNOSIS — R413 Other amnesia: Secondary | ICD-10-CM

## 2011-10-31 MED ORDER — IOHEXOL 300 MG/ML  SOLN
75.0000 mL | Freq: Once | INTRAMUSCULAR | Status: AC | PRN
  Administered 2011-10-31: 75 mL via INTRAVENOUS

## 2011-11-05 DIAGNOSIS — R5383 Other fatigue: Secondary | ICD-10-CM | POA: Diagnosis not present

## 2011-11-05 DIAGNOSIS — R5381 Other malaise: Secondary | ICD-10-CM | POA: Diagnosis not present

## 2011-11-25 DIAGNOSIS — R748 Abnormal levels of other serum enzymes: Secondary | ICD-10-CM | POA: Diagnosis not present

## 2011-12-03 ENCOUNTER — Encounter: Payer: Medicare Other | Admitting: Cardiology

## 2011-12-03 NOTE — Progress Notes (Signed)
HPI: Pleasant female for followup of coronary artery disease. Patient previously admitted to [facility] following a syncopal episode. While on telemetry she was noted to have asystole followed by polymorphic ventricular tachycardia. Her husband who is a retired nephrologist perform CPR. Chest CT showed no pulmonary embolus. Echocardiogram in December of 2012 showed normal LV function and no significant valvular disease. Cardiac enzymes were negative. Cardiac catheterization revealed a 80% circumflex and EF 60%; she had PCI with a drug-eluting stent. She also had a pacemaker placed because of her asystole. Lyme titers negative. UPEP showed normal free Kappa and Lambda light chains with normal ratio; polyclonal increase in light chains noted. TSH normal. ACE level normal. ESR 8. Ferritin Normal. SPEP showed nonspecific increase in alpha-1 region. I last saw her in Jan 2013. Since then, she was seen by Dr. Graciela Husbands for "spells". Apparently interrogation of her pacemaker showed no significant disturbance. She was seen by neurology.   Current Outpatient Prescriptions  Medication Sig Dispense Refill  . ALPRAZolam (XANAX) 0.5 MG tablet Take 0.5 mg by mouth at bedtime as needed. For sleep      . aspirin 81 MG tablet Take 1 tablet (81 mg total) by mouth daily.      . clopidogrel (PLAVIX) 75 MG tablet Take 1 tablet (75 mg total) by mouth daily with breakfast.  90 tablet  3  . Multiple Vitamin (MULTIVITAMIN) tablet Take 1 tablet by mouth daily.  90 tablet  1  . rosuvastatin (CRESTOR) 20 MG tablet Take 1 tablet (20 mg total) by mouth daily.  90 tablet  2  . timolol (BETIMOL) 0.25 % ophthalmic solution Place 1 drop into both eyes every morning.           Past Medical History  Diagnosis Date  . Intraocular pressure increase   . Rhinitis, allergic   . CAD (coronary artery disease)     Newly diagnosed by cath s/p DES to LCX 07/06/11. Normal EF by echo 06/2011.  Marland Kitchen Syncope     Documented asystole  followed by polymorphic VT on telemetry. s/p Medtronic PPM implantation 07/08/11. Cause of sinus arrest unclear, lyme titers pending     Past Surgical History  Procedure Date  . Appendectomy   . Tonsillectomy   . Abdominal hysterectomy   . Tonsillectomy   . Tubal ligation   . Pacemaker insertion     History   Social History  . Marital Status: Married    Spouse Name: N/A    Number of Children: N/A  . Years of Education: N/A   Occupational History  . Not on file.   Social History Main Topics  . Smoking status: Former Smoker    Quit date: 07/30/1967  . Smokeless tobacco: Never Used  . Alcohol Use: 0.6 oz/week    1 Glasses of wine per week     One glass of wine with dinner daily  . Drug Use: No  . Sexually Active: Not on file   Other Topics Concern  . Not on file   Social History Narrative  . No narrative on file    ROS: no fevers or chills, productive cough, hemoptysis, dysphasia, odynophagia, melena, hematochezia, dysuria, hematuria, rash, seizure activity, orthopnea, PND, pedal edema, claudication. Remaining systems are negative.  Physical Exam: Well-developed well-nourished in no acute distress.  Skin is warm and dry.  HEENT is normal.  Neck is supple. No thyromegaly.  Chest is clear to auscultation with normal expansion.  Cardiovascular exam is regular rate  and rhythm.  Abdominal exam nontender or distended. No masses palpated. Extremities show no edema. neuro grossly intact  ECG     This encounter was created in error - please disregard.

## 2011-12-04 ENCOUNTER — Telehealth: Payer: Self-pay | Admitting: Cardiology

## 2011-12-04 NOTE — Telephone Encounter (Signed)
Spoke with pt husband, appt rescheduled.

## 2011-12-04 NOTE — Telephone Encounter (Signed)
Pt missed appt yesterday and wants to be worked in before next available 01-17-12 because she is going to [place] in the summer starting 01-11-12

## 2011-12-26 ENCOUNTER — Ambulatory Visit (INDEPENDENT_AMBULATORY_CARE_PROVIDER_SITE_OTHER): Payer: Medicare Other | Admitting: Cardiology

## 2011-12-26 ENCOUNTER — Encounter: Payer: Self-pay | Admitting: Cardiology

## 2011-12-26 VITALS — BP 110/78 | HR 67 | Ht 65.0 in | Wt 143.0 lb

## 2011-12-26 DIAGNOSIS — Z950 Presence of cardiac pacemaker: Secondary | ICD-10-CM | POA: Diagnosis not present

## 2011-12-26 DIAGNOSIS — I2510 Atherosclerotic heart disease of native coronary artery without angina pectoris: Secondary | ICD-10-CM

## 2011-12-26 NOTE — Assessment & Plan Note (Signed)
Management per electrophysiology. 

## 2011-12-26 NOTE — Patient Instructions (Signed)
Your physician wants you to follow-up in: 6 MONTHS WITH DR CRENSHAW You will receive a reminder letter in the mail two months in advance. If you don't receive a letter, please call our office to schedule the follow-up appointment.  

## 2011-12-26 NOTE — Progress Notes (Signed)
   HPI: Pleasant female for followup of coronary artery disease. Patient recently admitted to [facility] following a syncopal episode. While on telemetry she was noted to have asystole followed by polymorphic ventricular tachycardia. Her husband who is a retired nephrologist perform CPR. Chest CT showed no pulmonary embolus. Echocardiogram in December of 2012 showed normal LV function and no significant valvular disease. Cardiac enzymes were negative. Cardiac catheterization revealed a 80% circumflex and EF 60%; she had PCI with a drug-eluting stent. She also had a pacemaker placed because of her asystole. Lyme titers negative. UPEP showed normal free Kappa and Lambda light chains with normal ratio; polyclonal increase in light chains noted. TSH normal. SPEP with nonspecific increase in Alpha-1-globulin. ACE level 23. ESR - 8. Ferritin 98. Paiient seen by Dr Graciela Husbands Feb 2013 with spells and interrogation of pacemaker apparently unremarkable. Referred to neurology. Since she was last seen, the patient denies any dyspnea on exertion, orthopnea, PND, pedal edema, palpitations, syncope or chest pain.    Current Outpatient Prescriptions  Medication Sig Dispense Refill  . aspirin 81 MG tablet Take 1 tablet (81 mg total) by mouth daily.      . clopidogrel (PLAVIX) 75 MG tablet Take 1 tablet (75 mg total) by mouth daily with breakfast.  90 tablet  3  . Multiple Vitamin (MULTIVITAMIN) tablet Take 1 tablet by mouth daily.  90 tablet  1  . timolol (BETIMOL) 0.25 % ophthalmic solution Place 1 drop into both eyes every morning.           Past Medical History  Diagnosis Date  . Intraocular pressure increase   . Rhinitis, allergic   . CAD (coronary artery disease)     Newly diagnosed by cath s/p DES to LCX 07/06/11. Normal EF by echo 06/2011.  Marland Kitchen Syncope     Documented asystole followed by polymorphic VT on telemetry. s/p Medtronic PPM implantation 07/08/11. Cause of sinus arrest unclear, lyme titers  pending     Past Surgical History  Procedure Date  . Appendectomy   . Tonsillectomy   . Abdominal hysterectomy   . Tonsillectomy   . Tubal ligation   . Pacemaker insertion     History   Social History  . Marital Status: Married    Spouse Name: N/A    Number of Children: N/A  . Years of Education: N/A   Occupational History  . Not on file.   Social History Main Topics  . Smoking status: Former Smoker    Quit date: 07/30/1967  . Smokeless tobacco: Never Used  . Alcohol Use: 0.6 oz/week    1 Glasses of wine per week     One glass of wine with dinner daily  . Drug Use: No  . Sexually Active: Not on file   Other Topics Concern  . Not on file   Social History Narrative  . No narrative on file    ROS: no fevers or chills, productive cough, hemoptysis, dysphasia, odynophagia, melena, hematochezia, dysuria, hematuria, rash, seizure activity, orthopnea, PND, pedal edema, claudication. Remaining systems are negative.  Physical Exam: Well-developed well-nourished in no acute distress.  Skin is warm and dry.  HEENT is normal.  Neck is supple.  Chest is clear to auscultation with normal expansion.  Cardiovascular exam is regular rate and rhythm.  Abdominal exam nontender or distended. No masses palpated. Extremities show no edema. neuro grossly intact

## 2011-12-26 NOTE — Assessment & Plan Note (Signed)
Continue aspirin and Plavix. Neurology discontinued her statin as they felt it may be contributing to her memory. We will consider resuming in the future if she can tolerate.

## 2012-01-09 ENCOUNTER — Telehealth: Payer: Self-pay | Admitting: Cardiology

## 2012-01-09 NOTE — Telephone Encounter (Signed)
Spoke w/husband. Ordered Field seismologist. Pt due in August but pt will return from [place] in September. Gave transmission date of 03-26-12 but husband may schedule an in office visit instead of transmission.

## 2012-01-09 NOTE — Telephone Encounter (Signed)
Please return call to patient husband Deborah Anderson at [phone] regarding patient device ck,  Patient will be leaving town for 3 months.

## 2012-01-15 ENCOUNTER — Encounter: Payer: Self-pay | Admitting: Cardiology

## 2012-04-30 DIAGNOSIS — H5310 Unspecified subjective visual disturbances: Secondary | ICD-10-CM | POA: Diagnosis not present

## 2012-05-20 DIAGNOSIS — R413 Other amnesia: Secondary | ICD-10-CM | POA: Diagnosis not present

## 2012-05-20 DIAGNOSIS — R55 Syncope and collapse: Secondary | ICD-10-CM | POA: Diagnosis not present

## 2012-05-20 DIAGNOSIS — I2510 Atherosclerotic heart disease of native coronary artery without angina pectoris: Secondary | ICD-10-CM | POA: Diagnosis not present

## 2012-05-21 DIAGNOSIS — R413 Other amnesia: Secondary | ICD-10-CM | POA: Diagnosis not present

## 2012-05-29 ENCOUNTER — Encounter: Payer: Self-pay | Admitting: Cardiology

## 2012-05-29 ENCOUNTER — Ambulatory Visit (INDEPENDENT_AMBULATORY_CARE_PROVIDER_SITE_OTHER): Payer: Medicare Other | Admitting: Cardiology

## 2012-05-29 VITALS — BP 123/69 | HR 70 | Wt 149.0 lb

## 2012-05-29 DIAGNOSIS — I2510 Atherosclerotic heart disease of native coronary artery without angina pectoris: Secondary | ICD-10-CM

## 2012-05-29 LAB — HEPATIC FUNCTION PANEL
AST: 21 U/L (ref 0–37)
Albumin: 3.8 g/dL (ref 3.5–5.2)
Alkaline Phosphatase: 30 U/L — ABNORMAL LOW (ref 39–117)
Total Protein: 6.7 g/dL (ref 6.0–8.3)

## 2012-05-29 LAB — CBC WITH DIFFERENTIAL/PLATELET
Basophils Relative: 0.4 % (ref 0.0–3.0)
Eosinophils Absolute: 0.1 10*3/uL (ref 0.0–0.7)
Eosinophils Relative: 1.3 % (ref 0.0–5.0)
HCT: 38.9 % (ref 36.0–46.0)
Hemoglobin: 12.7 g/dL (ref 12.0–15.0)
Lymphs Abs: 1.4 10*3/uL (ref 0.7–4.0)
MCHC: 32.7 g/dL (ref 30.0–36.0)
MCV: 92.6 fl (ref 78.0–100.0)
Monocytes Absolute: 0.5 10*3/uL (ref 0.1–1.0)
Neutro Abs: 4 10*3/uL (ref 1.4–7.7)
Neutrophils Relative %: 66.2 % (ref 43.0–77.0)
RBC: 4.2 Mil/uL (ref 3.87–5.11)
WBC: 6 10*3/uL (ref 4.5–10.5)

## 2012-05-29 LAB — BASIC METABOLIC PANEL
BUN: 20 mg/dL (ref 6–23)
CO2: 30 mEq/L (ref 19–32)
Calcium: 9.2 mg/dL (ref 8.4–10.5)
GFR: 78.07 mL/min (ref >60.00)
Glucose, Bld: 91 mg/dL (ref 70–99)
Sodium: 140 mEq/L (ref 135–145)

## 2012-05-29 NOTE — Assessment & Plan Note (Signed)
Management per electrophysiology. 

## 2012-05-29 NOTE — Progress Notes (Signed)
HPI: Pleasant female for followup of coronary artery disease. Patient previously admitted to [facility] following a syncopal episode. While on telemetry she was noted to have asystole followed by polymorphic ventricular tachycardia. Her husband who is a retired nephrologist perform CPR. Chest CT showed no pulmonary embolus. Echocardiogram in December of 2012 showed normal LV function and no significant valvular disease. Cardiac enzymes were negative. Cardiac catheterization revealed a 80% circumflex and EF 60%; she had PCI with a drug-eluting stent. She also had a pacemaker placed because of her asystole. Lyme titers negative. UPEP showed normal free Kappa and Lambda light chains with normal ratio; polyclonal increase in light chains noted. TSH normal. SPEP with nonspecific increase in Alpha-1-globulin. ACE level 23. ESR - 8. Ferritin 98. Paiient seen by Dr Graciela Husbands Feb 2013 with spells and interrogation of pacemaker apparently unremarkable. Referred to neurology. I last saw her in May of 2013. Since then, the patient denies any dyspnea on exertion, orthopnea, PND, pedal edema, palpitations, syncope or chest pain.    Current Outpatient Prescriptions  Medication Sig Dispense Refill  . aspirin 81 MG tablet Take 1 tablet (81 mg total) by mouth daily.      . clopidogrel (PLAVIX) 75 MG tablet Take 1 tablet (75 mg total) by mouth daily with breakfast.  90 tablet  3  . Multiple Vitamin (MULTIVITAMIN) tablet Take 1 tablet by mouth daily.  90 tablet  1  . timolol (BETIMOL) 0.25 % ophthalmic solution Place 1 drop into both eyes every morning.           Past Medical History  Diagnosis Date  . Intraocular pressure increase   . Rhinitis, allergic   . CAD (coronary artery disease)     Newly diagnosed by cath s/p DES to LCX 07/06/11. Normal EF by echo 06/2011.  Marland Kitchen Syncope     Documented asystole followed by polymorphic VT on telemetry. s/p Medtronic PPM implantation 07/08/11. Cause of sinus arrest  unclear, lyme titers pending     Past Surgical History  Procedure Date  . Appendectomy   . Tonsillectomy   . Abdominal hysterectomy   . Tonsillectomy   . Tubal ligation   . Pacemaker insertion     History   Social History  . Marital Status: Married    Spouse Name: N/A    Number of Children: N/A  . Years of Education: N/A   Occupational History  . Not on file.   Social History Main Topics  . Smoking status: Former Smoker    Quit date: 07/30/1967  . Smokeless tobacco: Never Used  . Alcohol Use: 0.6 oz/week    1 Glasses of wine per week     One glass of wine with dinner daily  . Drug Use: No  . Sexually Active: Not on file   Other Topics Concern  . Not on file   Social History Narrative  . No narrative on file    ROS: no fevers or chills, productive cough, hemoptysis, dysphasia, odynophagia, melena, hematochezia, dysuria, hematuria, rash, seizure activity, orthopnea, PND, pedal edema, claudication. Remaining systems are negative.  Physical Exam: Well-developed well-nourished in no acute distress.  Skin is warm and dry.  HEENT is normal.  Neck is supple.  Chest is clear to auscultation with normal expansion.  Cardiovascular exam is regular rate and rhythm.  Abdominal exam nontender or distended. No masses palpated. Extremities show no edema. neuro grossly intact  ECG sinus rhythm at a rate of 70. No significant ST changes.

## 2012-05-29 NOTE — Assessment & Plan Note (Signed)
The etiology of her previous cardiac arrest remains unclear. We are still concerned about infiltrative cardiomyopathy. Repeat echocardiogram. Repeat CBC, CMET, SPEP, UPEP and plasma for free light chains.

## 2012-05-29 NOTE — Addendum Note (Signed)
Addended by: Freddi Starr on: 05/29/2012 09:33 AM   Modules accepted: Orders

## 2012-05-29 NOTE — Assessment & Plan Note (Signed)
Continue aspirin and Plavix. Discontinue Plavix 07/29/2012. Patient not on a statin as it has affected her memory previously.

## 2012-05-29 NOTE — Patient Instructions (Addendum)
Your physician wants you to follow-up in: ONE YEAR WITH DR Shelda Pal will receive a reminder letter in the mail two months in advance. If you don't receive a letter, please call our office to schedule the follow-up appointment.   STOP PLAVIX  Your physician recommends that you HAVE LAB WORK TODAY  Your physician has requested that you have an echocardiogram. Echocardiography is a painless test that uses sound waves to create images of your heart. It provides your doctor with information about the size and shape of your heart and how well your heart's chambers and valves are working. This procedure takes approximately one hour. There are no restrictions for this procedure.

## 2012-06-01 LAB — KAPPA/LAMBDA LIGHT CHAINS: Kappa free light chain: 1.1 mg/dL (ref 0.33–1.94)

## 2012-06-02 LAB — PROTEIN ELECTROPHORESIS, SERUM
Beta 2: 4.5 % (ref 3.2–6.5)
Beta Globulin: 6.1 % (ref 4.7–7.2)
Gamma Globulin: 13.6 % (ref 11.1–18.8)

## 2012-06-02 LAB — PROTEIN ELECTROPHORESIS, URINE REFLEX

## 2012-06-03 ENCOUNTER — Ambulatory Visit (HOSPITAL_COMMUNITY): Payer: Medicare Other | Attending: Cardiovascular Disease | Admitting: Radiology

## 2012-06-03 DIAGNOSIS — R000 Tachycardia, unspecified: Secondary | ICD-10-CM | POA: Diagnosis not present

## 2012-06-03 DIAGNOSIS — Z950 Presence of cardiac pacemaker: Secondary | ICD-10-CM | POA: Insufficient documentation

## 2012-06-03 DIAGNOSIS — R55 Syncope and collapse: Secondary | ICD-10-CM | POA: Diagnosis not present

## 2012-06-03 DIAGNOSIS — I2510 Atherosclerotic heart disease of native coronary artery without angina pectoris: Secondary | ICD-10-CM | POA: Diagnosis not present

## 2012-06-03 DIAGNOSIS — I517 Cardiomegaly: Secondary | ICD-10-CM | POA: Insufficient documentation

## 2012-06-03 DIAGNOSIS — I469 Cardiac arrest, cause unspecified: Secondary | ICD-10-CM | POA: Diagnosis not present

## 2012-06-03 NOTE — Progress Notes (Signed)
Echocardiogram performed.  

## 2012-07-01 DIAGNOSIS — L259 Unspecified contact dermatitis, unspecified cause: Secondary | ICD-10-CM | POA: Diagnosis not present

## 2012-07-15 ENCOUNTER — Encounter: Payer: Self-pay | Admitting: Internal Medicine

## 2012-07-15 ENCOUNTER — Ambulatory Visit (INDEPENDENT_AMBULATORY_CARE_PROVIDER_SITE_OTHER): Payer: Medicare Other | Admitting: *Deleted

## 2012-07-15 DIAGNOSIS — I455 Other specified heart block: Secondary | ICD-10-CM | POA: Diagnosis not present

## 2012-07-15 DIAGNOSIS — R55 Syncope and collapse: Secondary | ICD-10-CM | POA: Diagnosis not present

## 2012-07-15 LAB — PACEMAKER DEVICE OBSERVATION
AL THRESHOLD: 0.5 V
ATRIAL PACING PM: 11
RV LEAD AMPLITUDE: 4 mv
RV LEAD IMPEDENCE PM: 585 Ohm
RV LEAD THRESHOLD: 0.75 V

## 2012-07-15 NOTE — Progress Notes (Signed)
PPM check 

## 2012-08-05 ENCOUNTER — Encounter: Payer: Medicare Other | Admitting: Internal Medicine

## 2012-10-07 ENCOUNTER — Ambulatory Visit (INDEPENDENT_AMBULATORY_CARE_PROVIDER_SITE_OTHER): Payer: Medicare Other | Admitting: Internal Medicine

## 2012-10-07 ENCOUNTER — Encounter: Payer: Self-pay | Admitting: Internal Medicine

## 2012-10-07 VITALS — BP 119/72 | HR 65 | Ht 63.5 in | Wt 149.6 lb

## 2012-10-07 DIAGNOSIS — I2510 Atherosclerotic heart disease of native coronary artery without angina pectoris: Secondary | ICD-10-CM

## 2012-10-07 DIAGNOSIS — I455 Other specified heart block: Secondary | ICD-10-CM | POA: Diagnosis not present

## 2012-10-07 DIAGNOSIS — Z950 Presence of cardiac pacemaker: Secondary | ICD-10-CM

## 2012-10-07 LAB — PACEMAKER DEVICE OBSERVATION
AL AMPLITUDE: 1.4 mv
BATTERY VOLTAGE: 2.79 V
RV LEAD AMPLITUDE: 4 mv
RV LEAD IMPEDENCE PM: 570 Ohm

## 2012-10-07 NOTE — Patient Instructions (Addendum)
Remote monitoring is used to monitor your Pacemaker of ICD from home. This monitoring reduces the number of office visits required to check your device to one time per year. It allows us to keep an eye on the functioning of your device to ensure it is working properly. You are scheduled for a device check from home on 01-11-2013. You may send your transmission at any time that day. If you have a wireless device, the transmission will be sent automatically. After your physician reviews your transmission, you will receive a postcard with your next transmission date.   Your physician wants you to follow-up in: ONE YEAR WITH DR KLEIN You will receive a reminder letter in the mail two months in advance. If you don't receive a letter, please call our office to schedule the follow-up appointment.  

## 2012-10-07 NOTE — Assessment & Plan Note (Signed)
The patient's device was interrogated.  The information was reviewed. No changes were made in the programming.    

## 2012-10-07 NOTE — Assessment & Plan Note (Signed)
Infrequent ventricular pacing-4% about 10-12% atrial pacing

## 2012-10-07 NOTE — Assessment & Plan Note (Addendum)
Continue ASA;  She is not taking statin therapy. This was stopped by neurology consultation because of concerns that might be contributing to cognitive issues. There is some thought from Dr. August Luz that her cognitive function has improved. They're disinclined this time to resume statin therapy

## 2012-10-07 NOTE — Progress Notes (Signed)
Patient Care Team: Hal Stormy Fabian, MD as PCP - General (Internal Medicine)   HPI  Deborah Anderson is a 67 y.o. female Seen in followup for a pacemaker implanted for asystole associated with syncope that was followed by polymorphic ventricular tachycardia. Date of implant December 2012.    Catheterization at that time had demonstrated an 80% circumflex lesion and she underwent DES placement  Investigations as to underlying cause included Lyme titers-negative; UPEP-negative; SPEP-nonspecific increase; ferritin-normal; sedimentation rate-normal. She saw neurology consultation; she has some dementia but no unifying diagnosis was identified.  The patient's device was interrogated.  The information was reviewed. No changes were made in the programming.       Past Medical History  Diagnosis Date  . Intraocular pressure increase   . Rhinitis, allergic   . CAD (coronary artery disease)     Newly diagnosed by cath s/p DES to LCX 07/06/11. Normal EF by echo 06/2011.  Marland Kitchen Syncope     Documented asystole followed by polymorphic VT on telemetry. s/p Medtronic PPM implantation 07/08/11. Cause of sinus arrest unclear, lyme titers pending     Past Surgical History  Procedure Laterality Date  . Appendectomy    . Tonsillectomy    . Abdominal hysterectomy    . Tonsillectomy    . Tubal ligation    . Pacemaker insertion      Current Outpatient Prescriptions  Medication Sig Dispense Refill  . aspirin 81 MG tablet Take 1 tablet (81 mg total) by mouth daily.      . Multiple Vitamin (MULTIVITAMIN) tablet Take 1 tablet by mouth daily.  90 tablet  1  . timolol (BETIMOL) 0.25 % ophthalmic solution Place 1 drop into both eyes every morning.         No current facility-administered medications for this visit.    No Known Allergies  Review of Systems negative except from HPI and PMH  Physical Exam BP 119/72  Pulse 65  Ht 5' 3.5" (1.613 m)  Wt 149 lb 9.6 oz (67.858 kg)  BMI 26.08 kg/m2 Well  developed and well nourished in no acute distress HENT normal E scleral and icterus clear Neck Supple JVP flat; carotids brisk and full Clear to ausculation Device pocket well healed; without hematoma or erythema Regular rate and rhythm, no murmurs gallops or rub Soft with active bowel sounds No clubbing cyanosis none Edema Alert and oriented, grossly normal motor and sensory function Skin Warm and Dry    Assessment and  Plan

## 2012-10-12 DIAGNOSIS — H40059 Ocular hypertension, unspecified eye: Secondary | ICD-10-CM | POA: Diagnosis not present

## 2013-01-11 ENCOUNTER — Encounter: Payer: Medicare Other | Admitting: *Deleted

## 2013-01-22 ENCOUNTER — Encounter: Payer: Self-pay | Admitting: *Deleted

## 2013-04-09 ENCOUNTER — Ambulatory Visit (INDEPENDENT_AMBULATORY_CARE_PROVIDER_SITE_OTHER): Payer: Medicare Other | Admitting: *Deleted

## 2013-04-09 DIAGNOSIS — I455 Other specified heart block: Secondary | ICD-10-CM

## 2013-04-09 DIAGNOSIS — Z950 Presence of cardiac pacemaker: Secondary | ICD-10-CM

## 2013-04-14 LAB — REMOTE PACEMAKER DEVICE
AL IMPEDENCE PM: 484 Ohm
ATRIAL PACING PM: 20
BATTERY VOLTAGE: 2.78 V
RV LEAD AMPLITUDE: 8 mv
VENTRICULAR PACING PM: 6

## 2013-05-19 ENCOUNTER — Encounter: Payer: Self-pay | Admitting: *Deleted

## 2013-06-04 ENCOUNTER — Encounter: Payer: Self-pay | Admitting: Internal Medicine

## 2013-06-09 IMAGING — CR DG CHEST 2V
2 series · 2 of 2 positions shown · non-contrast
Comparison: 01/04/2011

CLINICAL DATA: Pacemaker placement

CHEST - 2 VIEW

[w chest pa]
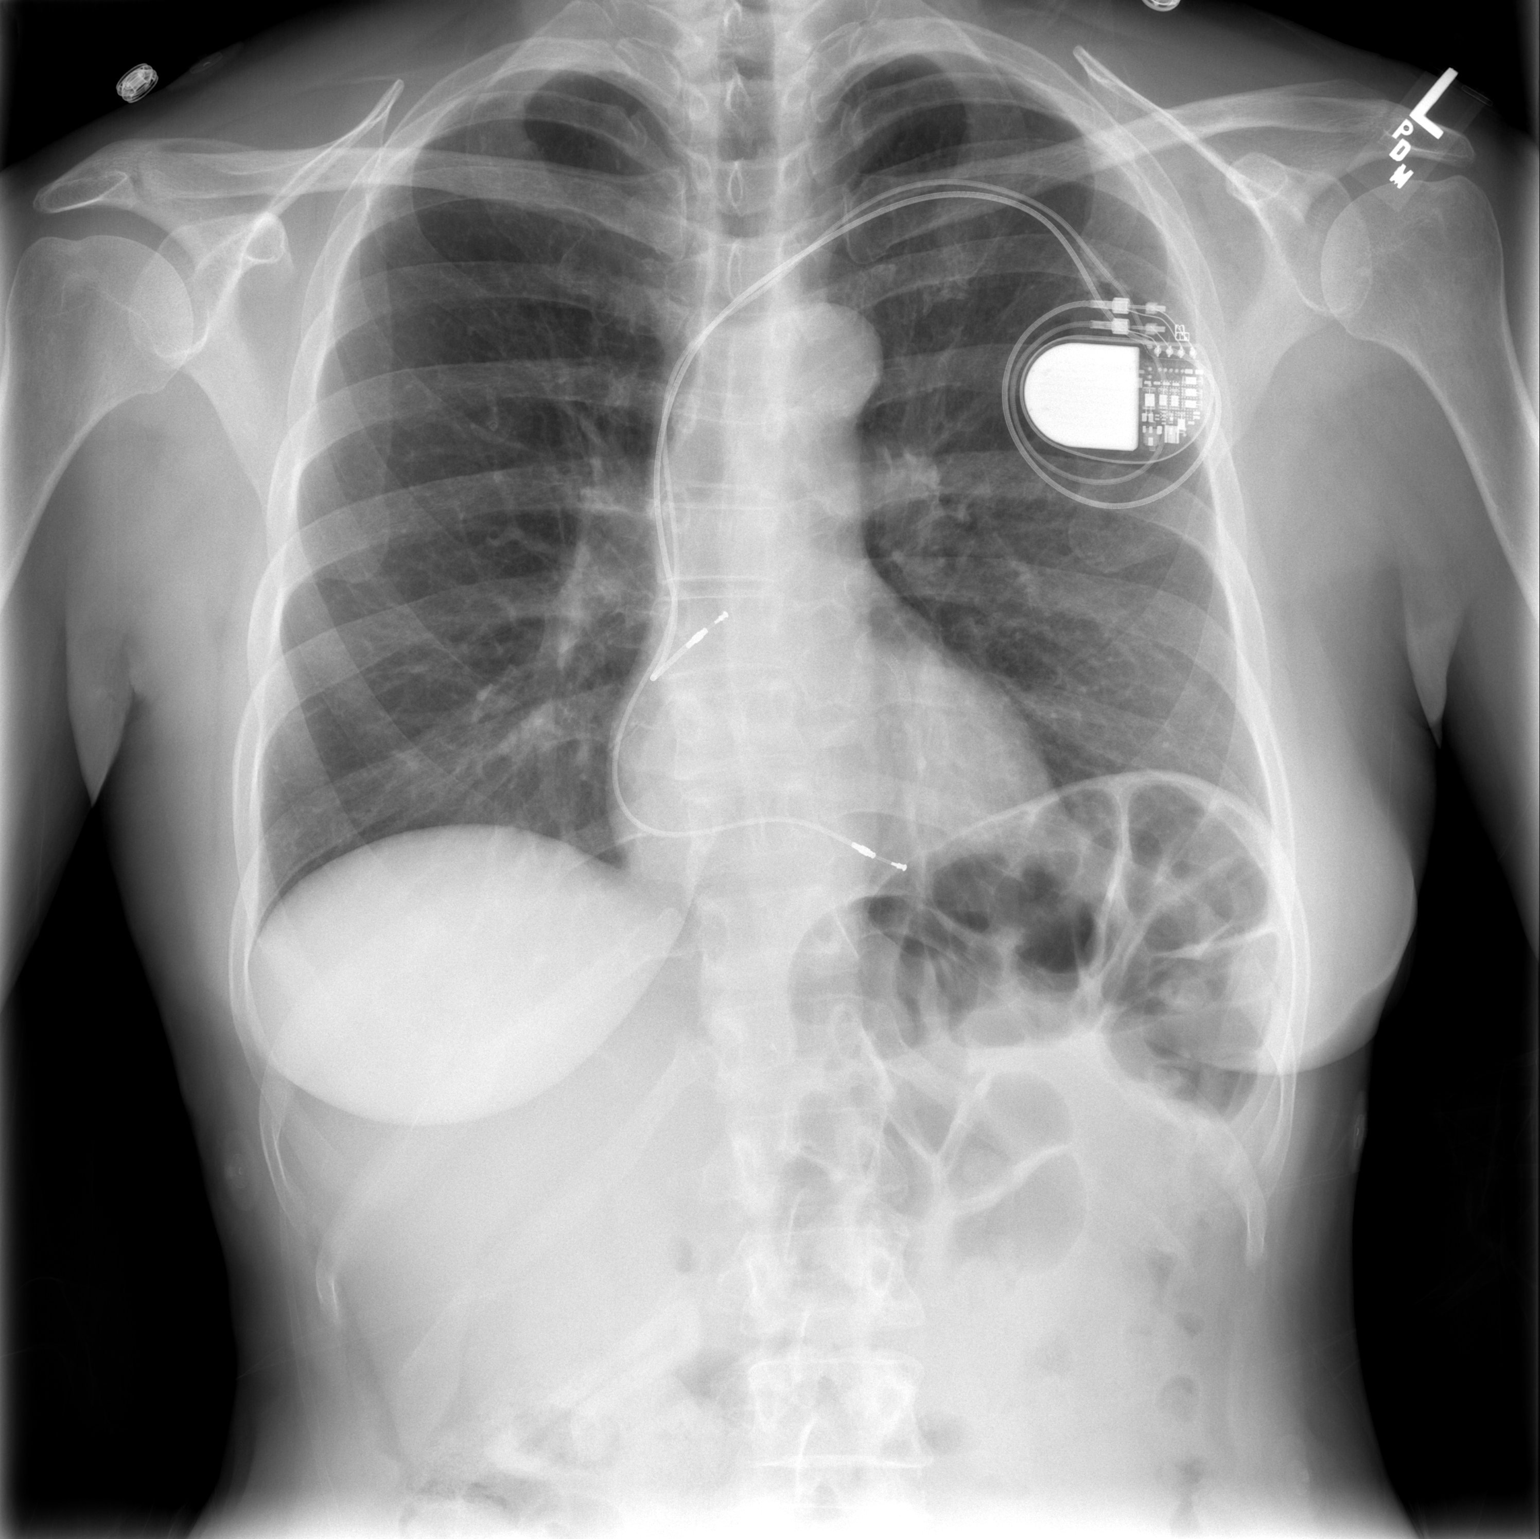

[w chest lat]
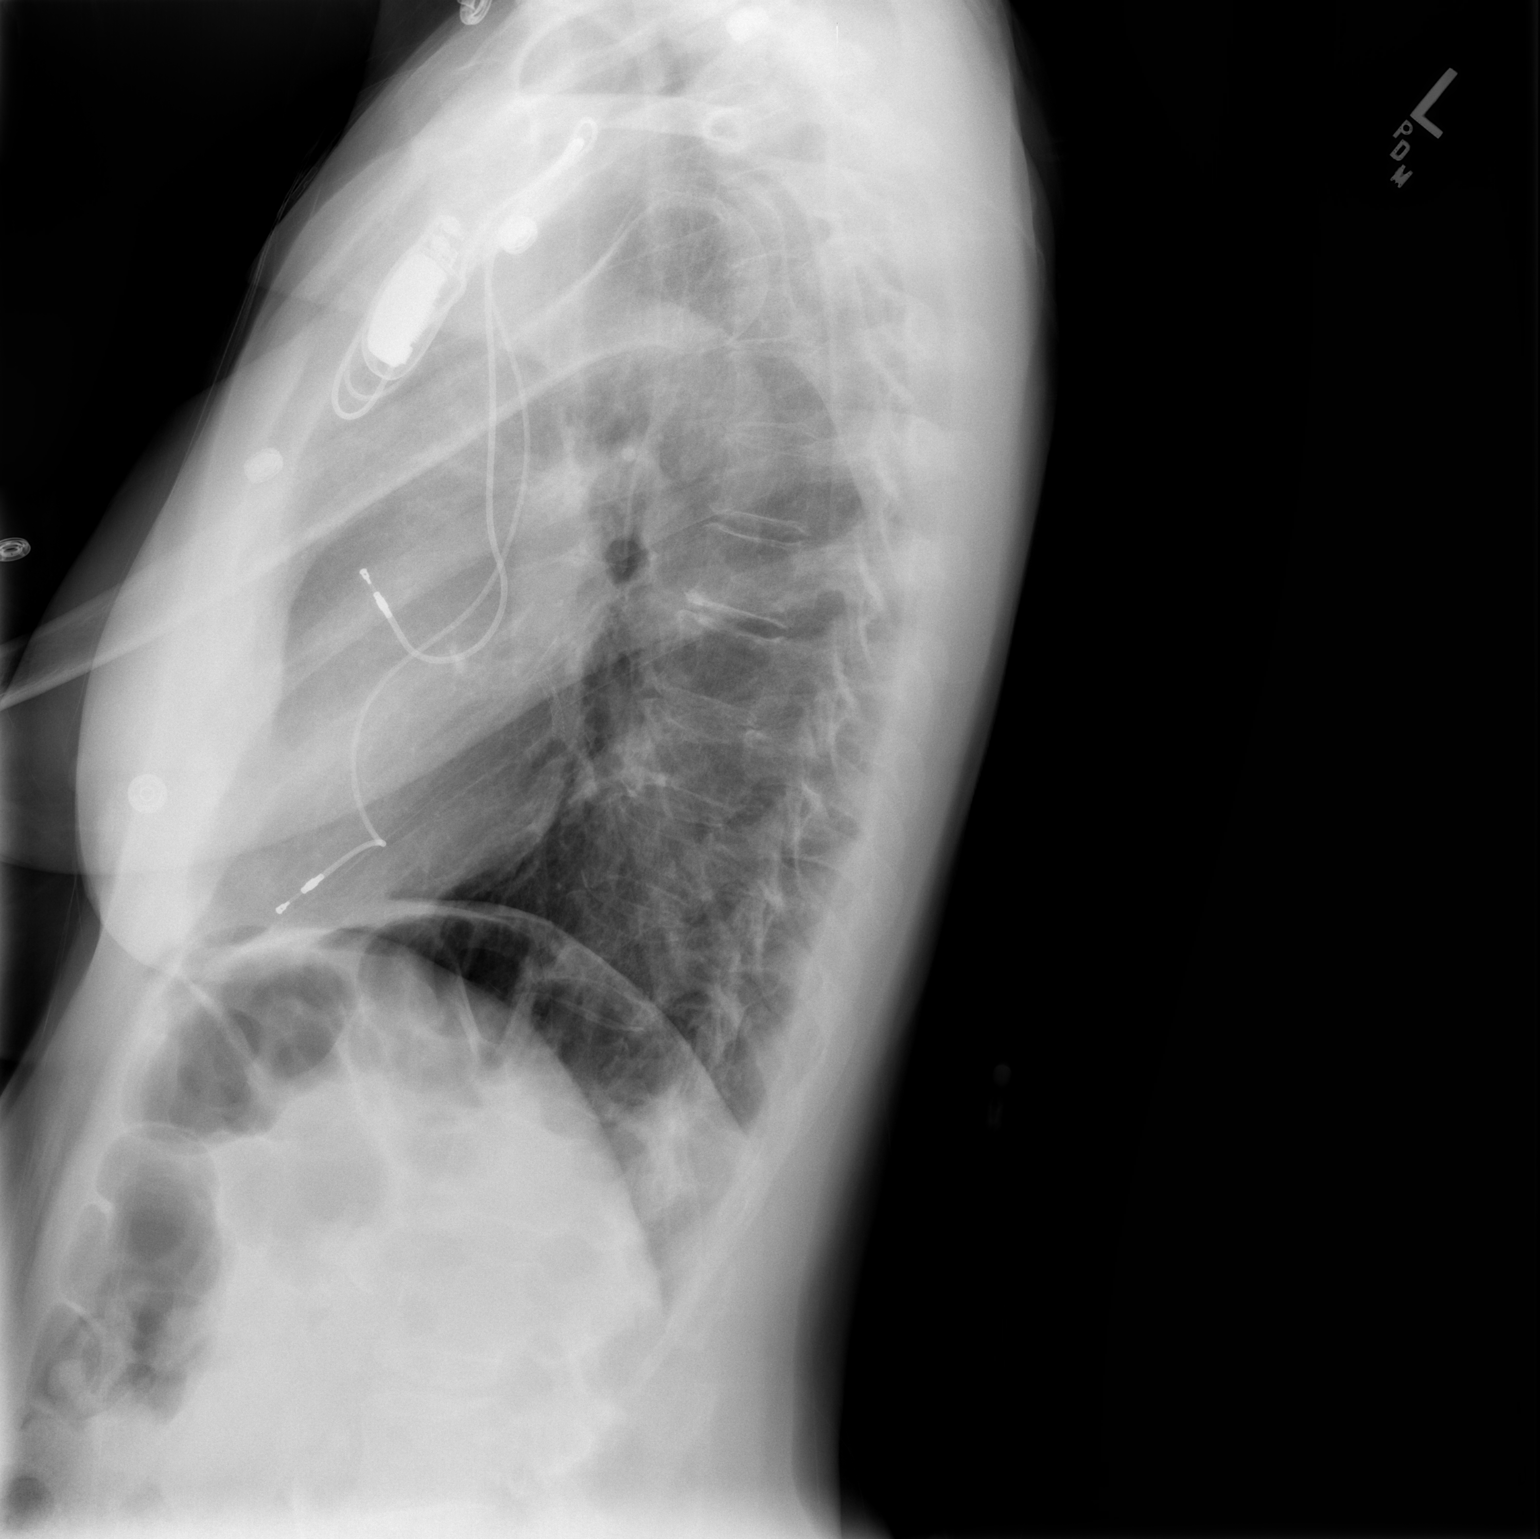

[2 of 2 positions shown; findings below may reference images not displayed]

FINDINGS: Left subclavian dual lead pacemaker has been placed.
Tips of the leads are in the right atrium and right ventricle.  No
pneumothorax.  Clear lungs.  No pleural effusion.  Normal heart
size.
IMPRESSION: Left subclavian dual lead pacemaker placement without complication.

## 2013-06-10 DIAGNOSIS — H4011X0 Primary open-angle glaucoma, stage unspecified: Secondary | ICD-10-CM | POA: Diagnosis not present

## 2013-06-10 DIAGNOSIS — H2510 Age-related nuclear cataract, unspecified eye: Secondary | ICD-10-CM | POA: Diagnosis not present

## 2013-06-10 DIAGNOSIS — H20049 Secondary noninfectious iridocyclitis, unspecified eye: Secondary | ICD-10-CM | POA: Diagnosis not present

## 2013-06-10 DIAGNOSIS — H409 Unspecified glaucoma: Secondary | ICD-10-CM | POA: Diagnosis not present

## 2013-07-12 ENCOUNTER — Encounter: Payer: Medicare Other | Admitting: *Deleted

## 2013-07-16 ENCOUNTER — Encounter: Payer: Self-pay | Admitting: *Deleted

## 2013-07-16 DIAGNOSIS — H409 Unspecified glaucoma: Secondary | ICD-10-CM | POA: Diagnosis not present

## 2013-07-16 DIAGNOSIS — H40059 Ocular hypertension, unspecified eye: Secondary | ICD-10-CM | POA: Diagnosis not present

## 2013-07-16 DIAGNOSIS — H4011X0 Primary open-angle glaucoma, stage unspecified: Secondary | ICD-10-CM | POA: Diagnosis not present

## 2013-07-23 ENCOUNTER — Telehealth: Payer: Self-pay | Admitting: Internal Medicine

## 2013-07-23 ENCOUNTER — Ambulatory Visit (INDEPENDENT_AMBULATORY_CARE_PROVIDER_SITE_OTHER): Payer: Medicare Other | Admitting: *Deleted

## 2013-07-23 DIAGNOSIS — I455 Other specified heart block: Secondary | ICD-10-CM | POA: Diagnosis not present

## 2013-07-23 NOTE — Telephone Encounter (Signed)
Transmission received, patient aware. 

## 2013-07-23 NOTE — Telephone Encounter (Signed)
New Message  Pt called states that a letter was received indicating that the remote transmission was not received.. Pt requests a call back to discuss.

## 2013-08-02 LAB — MDC_IDC_ENUM_SESS_TYPE_REMOTE
Battery Impedance: 133 Ohm
Battery Remaining Longevity: 164 mo
Battery Voltage: 2.79 V
Brady Statistic AP VP Percent: 5 %
Brady Statistic AP VS Percent: 13 %
Brady Statistic AS VP Percent: 0 %
Brady Statistic AS VS Percent: 81 %
Date Time Interrogation Session: 20141226194456
Lead Channel Impedance Value: 537 Ohm
Lead Channel Impedance Value: 588 Ohm
Lead Channel Pacing Threshold Amplitude: 0.375 V
Lead Channel Pacing Threshold Amplitude: 0.625 V
Lead Channel Pacing Threshold Pulse Width: 0.4 ms
Lead Channel Pacing Threshold Pulse Width: 0.4 ms
Lead Channel Sensing Intrinsic Amplitude: 11.2 mV
Lead Channel Sensing Intrinsic Amplitude: 2.8 mV
Lead Channel Setting Pacing Amplitude: 1.5 V
Lead Channel Setting Pacing Amplitude: 2 V
Lead Channel Setting Pacing Pulse Width: 0.4 ms
Lead Channel Setting Sensing Sensitivity: 2 mV

## 2013-08-12 ENCOUNTER — Encounter: Payer: Self-pay | Admitting: *Deleted

## 2013-09-13 ENCOUNTER — Encounter: Payer: Self-pay | Admitting: Internal Medicine

## 2013-10-01 IMAGING — CT CT HEAD WO/W CM
3 series · 18 of 30 positions shown, 20 images · non-contrast
Comparison: none

[Series 2: head w/o · axial · non-contrast · 0.49mm/px · z∈[+33,+140]mm · 8 of 28 slices shown, 10 images]
[im 4/28  brain]
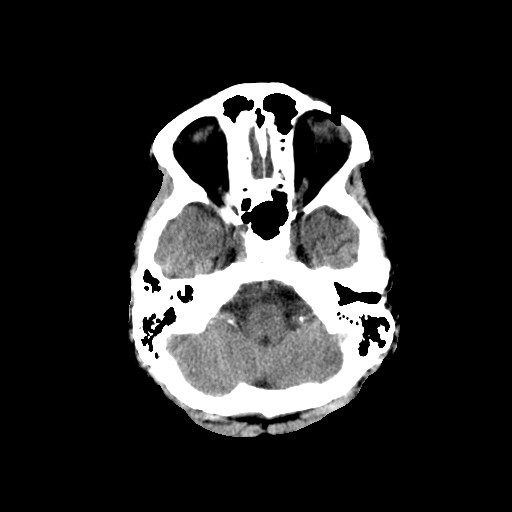
[im 4/28  bone]
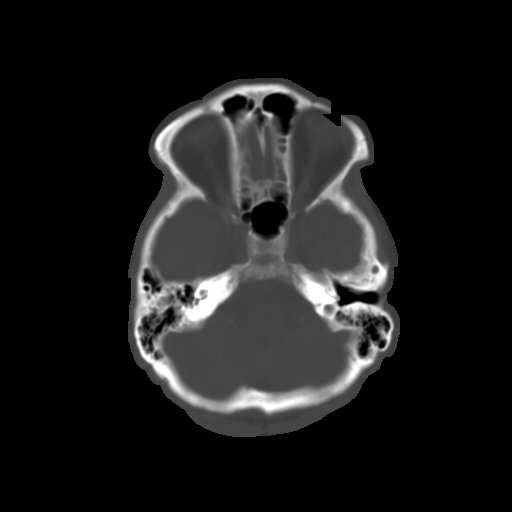
[im 7/28  brain]
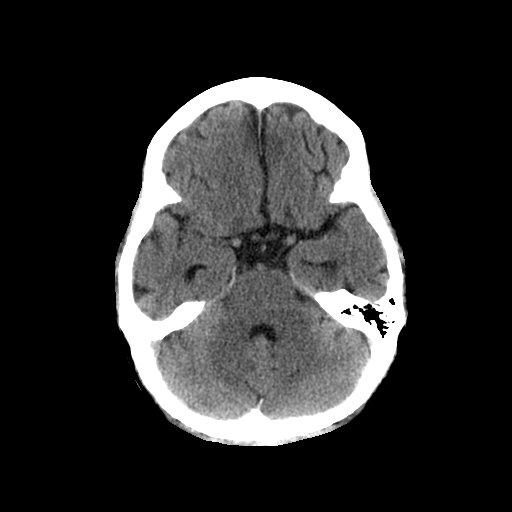
[im 10/28  brain]
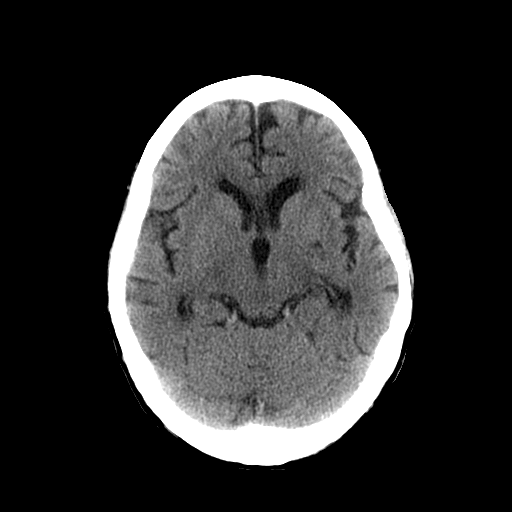
[im 13/28  brain]
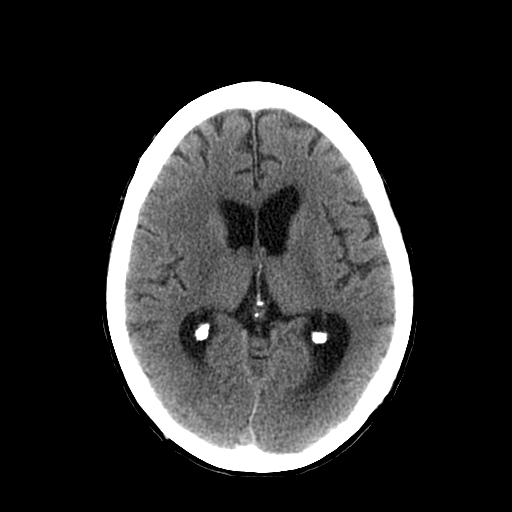
[im 16/28  brain]
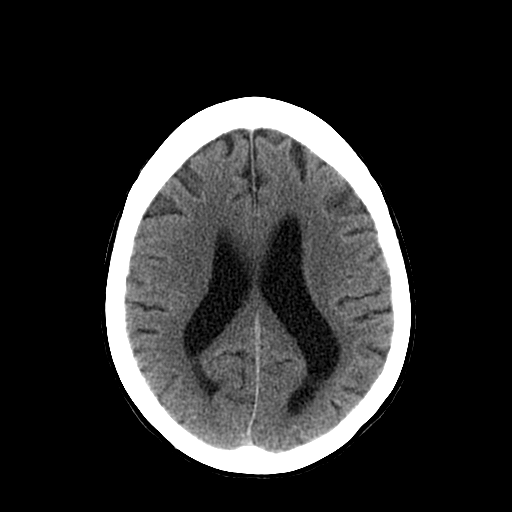
[im 16/28  bone]
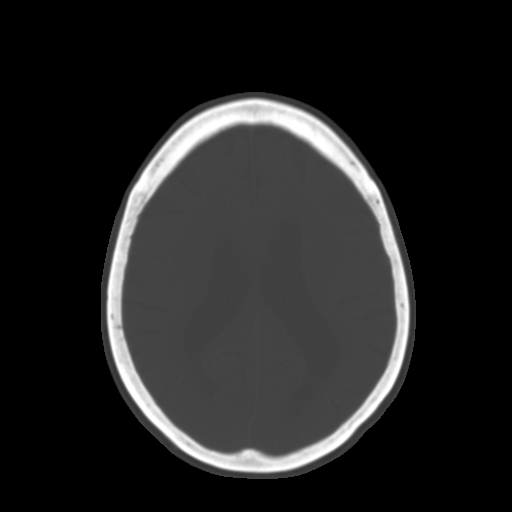
[im 19/28  brain]
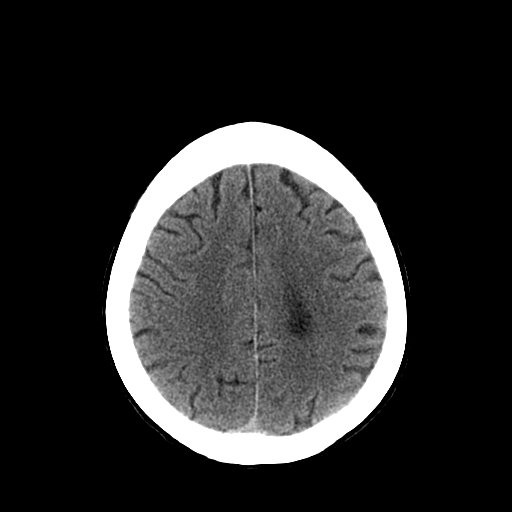
[im 22/28  brain]
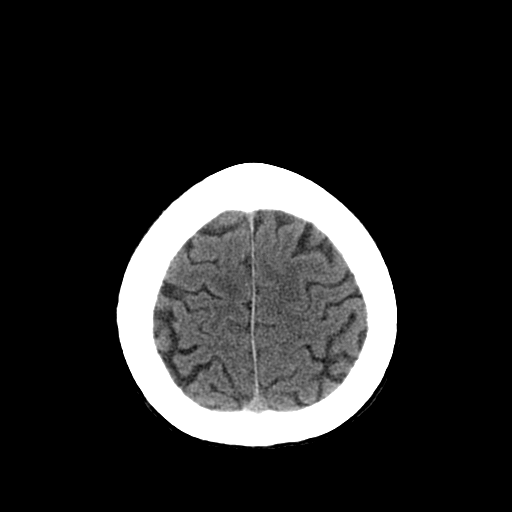
[im 25/28  brain]
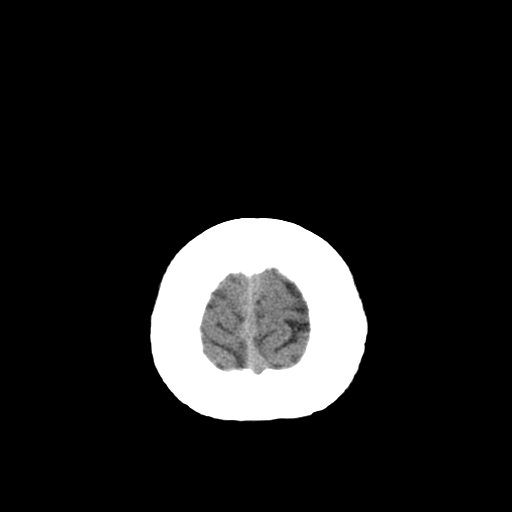

[Series 3: head bone · axial · 0.49mm/px · z∈[+33,+48]mm · 2 of 28 slices shown]
[im 4/28  bone]
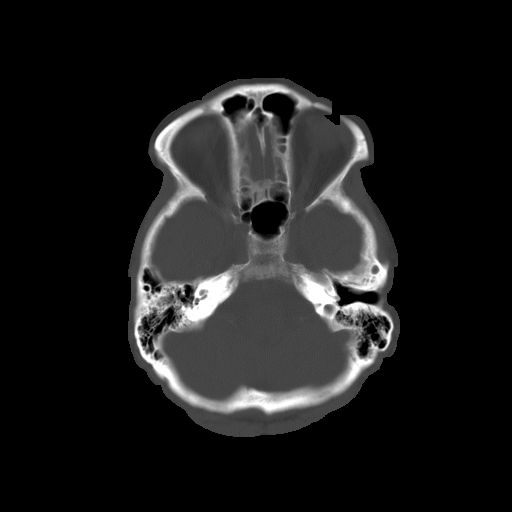
[im 7/28  bone]
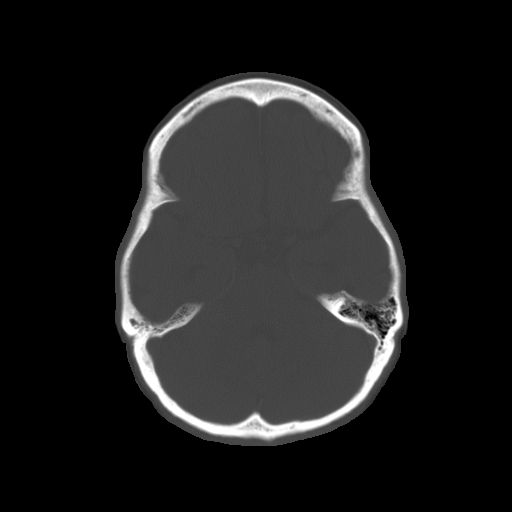

[Series 4: head with cm · axial · 0.49mm/px · z∈[+33,+140]mm · 8 of 28 slices shown]
[im 4/28  brain]
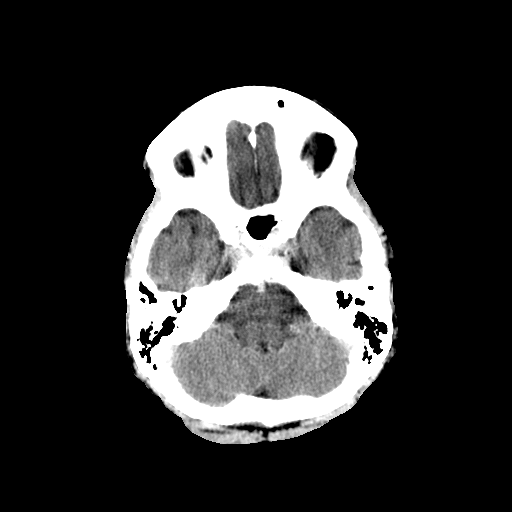
[im 7/28  brain]
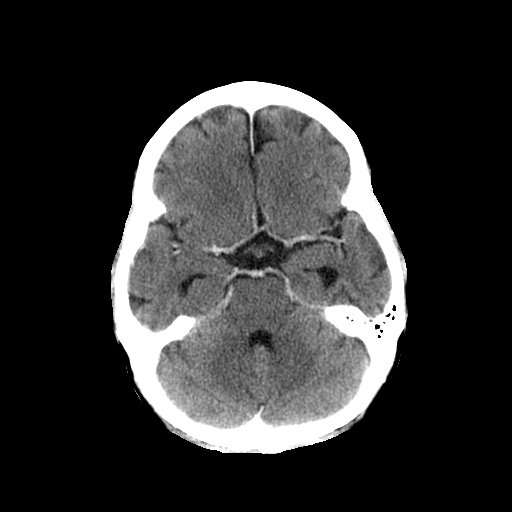
[im 10/28  brain]
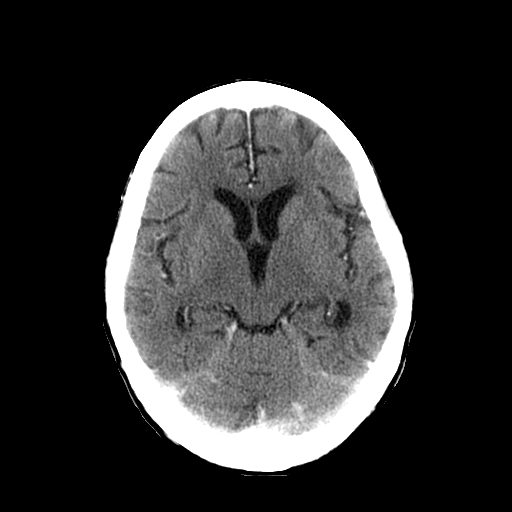
[im 13/28  brain]
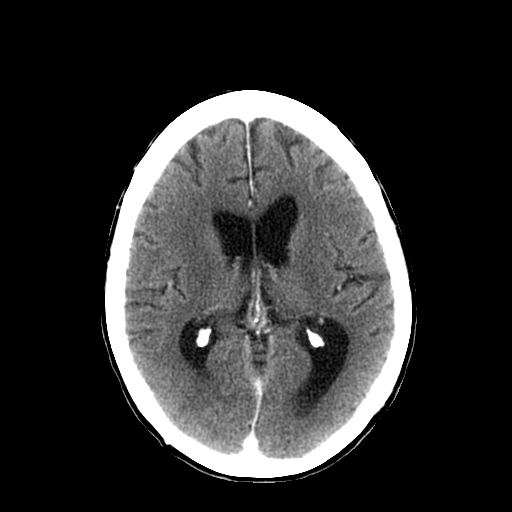
[im 16/28  brain]
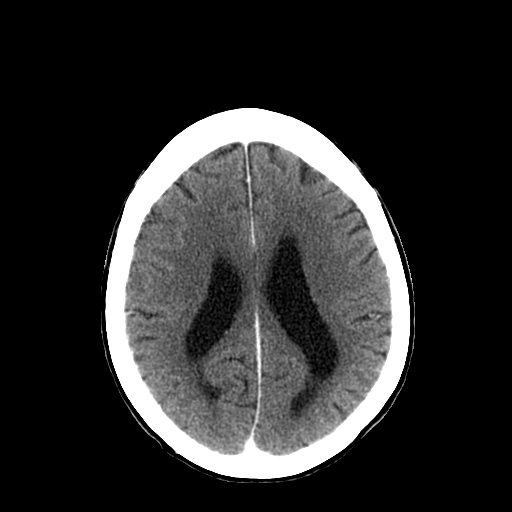
[im 19/28  brain]
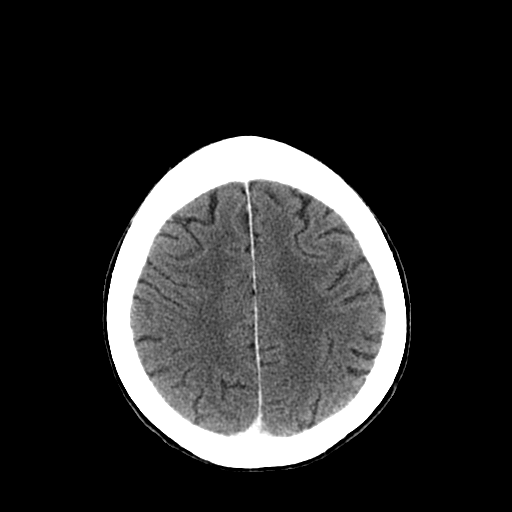
[im 22/28  brain]
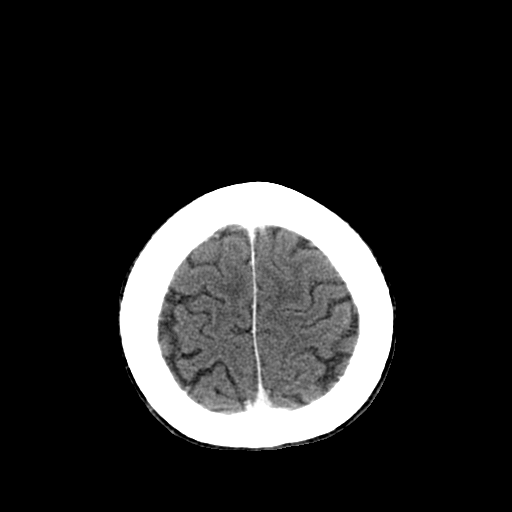
[im 25/28  brain]
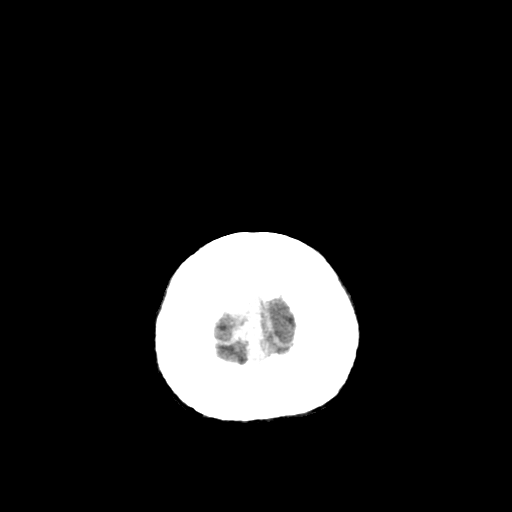

[18 of 30 positions shown; findings below may reference images not displayed]

This examination was performed at [HOSPITAL] at [HOSPITAL]. The interpretation will be provided by [REDACTED].

BUN and creatinine were obtained on site at [HOSPITAL] at
[HOSPITAL]..
Results:  BUN 14 mg/dL,  Creatinine 0.7 mg/dL.

## 2013-10-28 ENCOUNTER — Other Ambulatory Visit (HOSPITAL_COMMUNITY): Payer: Self-pay | Admitting: Geriatric Medicine

## 2013-10-28 DIAGNOSIS — K219 Gastro-esophageal reflux disease without esophagitis: Secondary | ICD-10-CM | POA: Diagnosis not present

## 2013-10-28 DIAGNOSIS — R5381 Other malaise: Secondary | ICD-10-CM | POA: Diagnosis not present

## 2013-10-28 DIAGNOSIS — Z79899 Other long term (current) drug therapy: Secondary | ICD-10-CM | POA: Diagnosis not present

## 2013-10-28 DIAGNOSIS — F801 Expressive language disorder: Secondary | ICD-10-CM | POA: Diagnosis not present

## 2013-10-28 DIAGNOSIS — F0280 Dementia in other diseases classified elsewhere without behavioral disturbance: Secondary | ICD-10-CM

## 2013-10-28 DIAGNOSIS — G309 Alzheimer's disease, unspecified: Principal | ICD-10-CM

## 2013-10-28 DIAGNOSIS — Z23 Encounter for immunization: Secondary | ICD-10-CM | POA: Diagnosis not present

## 2013-11-01 ENCOUNTER — Ambulatory Visit (INDEPENDENT_AMBULATORY_CARE_PROVIDER_SITE_OTHER): Payer: Medicare Other | Admitting: Internal Medicine

## 2013-11-01 ENCOUNTER — Encounter: Payer: Self-pay | Admitting: Internal Medicine

## 2013-11-01 VITALS — BP 123/69 | HR 60 | Ht 65.0 in | Wt 155.0 lb

## 2013-11-01 DIAGNOSIS — I455 Other specified heart block: Secondary | ICD-10-CM

## 2013-11-01 DIAGNOSIS — Z950 Presence of cardiac pacemaker: Secondary | ICD-10-CM | POA: Diagnosis not present

## 2013-11-01 DIAGNOSIS — I2510 Atherosclerotic heart disease of native coronary artery without angina pectoris: Secondary | ICD-10-CM | POA: Diagnosis not present

## 2013-11-01 LAB — MDC_IDC_ENUM_SESS_TYPE_INCLINIC
Battery Remaining Longevity: 164 mo
Battery Voltage: 2.79 V
Brady Statistic AP VP Percent: 5 %
Brady Statistic AS VP Percent: 0 %
Lead Channel Impedance Value: 566 Ohm
Lead Channel Pacing Threshold Amplitude: 0.5 V
Lead Channel Pacing Threshold Amplitude: 0.75 V
Lead Channel Pacing Threshold Pulse Width: 0.4 ms
Lead Channel Pacing Threshold Pulse Width: 0.4 ms
Lead Channel Sensing Intrinsic Amplitude: 2 mV
Lead Channel Sensing Intrinsic Amplitude: 4 mV
Lead Channel Setting Pacing Amplitude: 2 V
Lead Channel Setting Pacing Amplitude: 2.5 V
Lead Channel Setting Pacing Pulse Width: 0.4 ms
Lead Channel Setting Sensing Sensitivity: 2 mV
MDC IDC MSMT BATTERY IMPEDANCE: 134 Ohm
MDC IDC MSMT LEADCHNL RA IMPEDANCE VALUE: 471 Ohm
MDC IDC SESS DTM: 20150406091308
MDC IDC STAT BRADY AP VS PERCENT: 12 %
MDC IDC STAT BRADY AS VS PERCENT: 83 %

## 2013-11-01 NOTE — Progress Notes (Signed)
      Patient Care Team: Merlene LaughterHal Stoneking, MD as PCP - General (Internal Medicine)   HPI  Deborah Anderson is a 68 y.o. female  Seen in followup for a pacemaker implanted for asystole associated with syncope that was followed by polymorphic ventricular tachycardia. Date of implant December 2012.  Catheterization at that time had demonstrated an 80% circumflex lesion and she underwent DES placement  Investigations as to underlying cause included Lyme titers-negative; UPEP-negative; SPEP-nonspecific increase; ferritin-normal; sedimentation rate-normal. She saw neurology consultation; she has some dementia but no unifying diagnosis was identified.  The patient's device was interrogated. The information was reviewed. No changes were made in the programming.   The patient denies chest pain, shortness of breath, nocturnal dyspnea, orthopnea or peripheral edema.  There have been no palpitations, lightheadedness or syncope.    Past Medical History  Diagnosis Date  . Intraocular pressure increase   . Rhinitis, allergic   . CAD (coronary artery disease)     Newly diagnosed by cath s/p DES to LCX 07/06/11. Normal EF by echo 06/2011.  Marland Kitchen. Syncope     Documented asystole followed by polymorphic VT on telemetry. s/p Medtronic PPM implantation 07/08/11. Cause of sinus arrest unclear, lyme titers pending     Past Surgical History  Procedure Laterality Date  . Appendectomy    . Tonsillectomy    . Abdominal hysterectomy    . Tonsillectomy    . Tubal ligation    . Pacemaker insertion      Current Outpatient Prescriptions  Medication Sig Dispense Refill  . aspirin 81 MG tablet Take 1 tablet (81 mg total) by mouth daily.      . Multiple Vitamin (MULTIVITAMIN) tablet Take 1 tablet by mouth daily.  90 tablet  1  . timolol (BETIMOL) 0.25 % ophthalmic solution Place 1 drop into both eyes 2 (two) times daily.       Marland Kitchen. triamcinolone cream (KENALOG) 0.1 % as needed.       No current facility-administered  medications for this visit.    No Known Allergies  Review of Systems negative except from HPI and PMH  Physical Exam BP 123/69  Pulse 60  Ht 5\' 5"  (1.651 m)  Wt 155 lb (70.308 kg)  BMI 25.79 kg/m2 Well developed and well nourished in no acute distress HENT normal E scleral and icterus clear Neck Supple JVP flat; carotids brisk and full Clear to ausculation  Regular rate and rhythm, no murmurs gallops or rub Soft with active bowel sounds No clubbing cyanosis no Edema Alert and oriented, grossly normal motor and sensory function Skin Warm and Dry    Assessment and  Plan  Bradycardia   VT-polymorphic  Pause dependent No intercurrent Ventricular tachycardia   Pacemaker Medtronic  The patient's device was interrogated.  The information was reviewed. No changes were made in the programming.    Stable will continue current meds 35% apacing without vt detectec

## 2013-11-01 NOTE — Patient Instructions (Signed)
Your physician recommends that you continue on your current medications as directed. Please refer to the Current Medication list given to you today.  Please send a transmission once you have received your My Smart monitor in the mail.  Your physician wants you to follow-up in: 6 months with Dr. Jens Somrenshaw. You will receive a reminder letter in the mail two months in advance. If you don't receive a letter, please call our office to schedule the follow-up appointment.  Your physician wants you to follow-up in: 1 year with Dr. Graciela HusbandsKlein.  You will receive a reminder letter in the mail two months in advance. If you don't receive a letter, please call our office to schedule the follow-up appointment.

## 2013-11-05 ENCOUNTER — Encounter: Payer: Self-pay | Admitting: *Deleted

## 2013-11-08 ENCOUNTER — Ambulatory Visit (HOSPITAL_COMMUNITY): Payer: Medicare Other

## 2013-12-03 DIAGNOSIS — H40059 Ocular hypertension, unspecified eye: Secondary | ICD-10-CM | POA: Diagnosis not present

## 2013-12-03 DIAGNOSIS — H409 Unspecified glaucoma: Secondary | ICD-10-CM | POA: Diagnosis not present

## 2013-12-03 DIAGNOSIS — H4011X0 Primary open-angle glaucoma, stage unspecified: Secondary | ICD-10-CM | POA: Diagnosis not present

## 2013-12-13 DIAGNOSIS — R21 Rash and other nonspecific skin eruption: Secondary | ICD-10-CM | POA: Diagnosis not present

## 2013-12-13 DIAGNOSIS — L259 Unspecified contact dermatitis, unspecified cause: Secondary | ICD-10-CM | POA: Diagnosis not present

## 2013-12-21 ENCOUNTER — Ambulatory Visit (HOSPITAL_COMMUNITY)
Admission: RE | Admit: 2013-12-21 | Discharge: 2013-12-21 | Disposition: A | Discharge status: Home / Self Care | Payer: Medicare Other | Source: Ambulatory Visit | Attending: Geriatric Medicine | Admitting: Geriatric Medicine

## 2013-12-21 ENCOUNTER — Other Ambulatory Visit: Payer: Self-pay

## 2013-12-21 DIAGNOSIS — F0390 Unspecified dementia without behavioral disturbance: Secondary | ICD-10-CM | POA: Diagnosis not present

## 2013-12-21 DIAGNOSIS — R413 Other amnesia: Secondary | ICD-10-CM | POA: Insufficient documentation

## 2013-12-21 DIAGNOSIS — G309 Alzheimer's disease, unspecified: Secondary | ICD-10-CM

## 2013-12-21 DIAGNOSIS — F801 Expressive language disorder: Secondary | ICD-10-CM | POA: Insufficient documentation

## 2013-12-21 DIAGNOSIS — F0280 Dementia in other diseases classified elsewhere without behavioral disturbance: Secondary | ICD-10-CM | POA: Diagnosis not present

## 2013-12-21 DIAGNOSIS — Z1231 Encounter for screening mammogram for malignant neoplasm of breast: Secondary | ICD-10-CM

## 2013-12-21 MED ORDER — FLUDEOXYGLUCOSE F - 18 (FDG) INJECTION
9.9000 | Freq: Once | INTRAVENOUS | Status: AC | PRN
  Administered 2013-12-21: 9.9 via INTRAVENOUS

## 2013-12-23 ENCOUNTER — Encounter: Payer: Self-pay | Admitting: Cardiology

## 2013-12-23 NOTE — Telephone Encounter (Signed)
New message     Pt is having a colonscopy on 6-1----can she stop her asprin prior to procedure.  Clearance was already faxed.  Fax clearance to [phone]

## 2013-12-24 ENCOUNTER — Ambulatory Visit
Admission: RE | Admit: 2013-12-24 | Discharge: 2013-12-24 | Disposition: A | Discharge status: Home / Self Care | Payer: Medicare Other | Source: Ambulatory Visit

## 2013-12-24 ENCOUNTER — Encounter (INDEPENDENT_AMBULATORY_CARE_PROVIDER_SITE_OTHER): Payer: Self-pay

## 2013-12-24 DIAGNOSIS — Z1231 Encounter for screening mammogram for malignant neoplasm of breast: Secondary | ICD-10-CM

## 2013-12-27 DIAGNOSIS — Z1211 Encounter for screening for malignant neoplasm of colon: Secondary | ICD-10-CM | POA: Diagnosis not present

## 2013-12-27 DIAGNOSIS — K5730 Diverticulosis of large intestine without perforation or abscess without bleeding: Secondary | ICD-10-CM | POA: Diagnosis not present

## 2013-12-27 DIAGNOSIS — Z86010 Personal history of colonic polyps: Secondary | ICD-10-CM | POA: Diagnosis not present

## 2014-01-11 NOTE — Telephone Encounter (Signed)
This encounter was created in error - please disregard.

## 2014-02-01 ENCOUNTER — Ambulatory Visit: Payer: Medicare Other | Admitting: *Deleted

## 2014-02-01 ENCOUNTER — Telehealth: Payer: Self-pay | Admitting: Cardiology

## 2014-02-01 NOTE — Telephone Encounter (Signed)
LMOVM reminding pt to send remote transmission.   

## 2014-02-03 ENCOUNTER — Encounter: Payer: Self-pay | Admitting: Cardiology

## 2014-02-04 ENCOUNTER — Ambulatory Visit (INDEPENDENT_AMBULATORY_CARE_PROVIDER_SITE_OTHER): Payer: Medicare Other | Admitting: *Deleted

## 2014-02-04 DIAGNOSIS — I472 Ventricular tachycardia: Secondary | ICD-10-CM | POA: Diagnosis not present

## 2014-02-04 DIAGNOSIS — I4729 Other ventricular tachycardia: Secondary | ICD-10-CM

## 2014-02-04 DIAGNOSIS — I455 Other specified heart block: Secondary | ICD-10-CM

## 2014-02-04 LAB — MDC_IDC_ENUM_SESS_TYPE_REMOTE
Battery Remaining Longevity: 151 mo
Battery Voltage: 2.79 V
Brady Statistic AP VP Percent: 3 %
Brady Statistic AP VS Percent: 8 %
Brady Statistic AS VP Percent: 0 %
Date Time Interrogation Session: 20150710215151
Lead Channel Impedance Value: 578 Ohm
Lead Channel Pacing Threshold Amplitude: 0.375 V
Lead Channel Pacing Threshold Amplitude: 0.625 V
Lead Channel Pacing Threshold Pulse Width: 0.4 ms
Lead Channel Pacing Threshold Pulse Width: 0.4 ms
Lead Channel Sensing Intrinsic Amplitude: 1 mV
Lead Channel Sensing Intrinsic Amplitude: 5.6 mV
Lead Channel Setting Pacing Amplitude: 2.5 V
Lead Channel Setting Pacing Pulse Width: 0.4 ms
Lead Channel Setting Sensing Sensitivity: 2 mV
MDC IDC MSMT BATTERY IMPEDANCE: 134 Ohm
MDC IDC MSMT LEADCHNL RA IMPEDANCE VALUE: 445 Ohm
MDC IDC SET LEADCHNL RA PACING AMPLITUDE: 2 V
MDC IDC STAT BRADY AS VS PERCENT: 89 %

## 2014-02-08 NOTE — Progress Notes (Signed)
Remote pacemaker transmission.   

## 2014-03-22 ENCOUNTER — Encounter: Payer: Self-pay | Admitting: Cardiology

## 2014-03-28 ENCOUNTER — Encounter: Payer: Self-pay | Admitting: Internal Medicine

## 2014-05-03 DIAGNOSIS — Z23 Encounter for immunization: Secondary | ICD-10-CM | POA: Diagnosis not present

## 2014-05-09 ENCOUNTER — Telehealth: Payer: Self-pay | Admitting: Cardiology

## 2014-05-09 ENCOUNTER — Encounter: Payer: Medicare Other | Admitting: *Deleted

## 2014-05-09 NOTE — Telephone Encounter (Signed)
LMOVM reminding pt to send remote transmission.   

## 2014-05-10 ENCOUNTER — Encounter: Payer: Self-pay | Admitting: Cardiology

## 2014-05-13 ENCOUNTER — Ambulatory Visit (INDEPENDENT_AMBULATORY_CARE_PROVIDER_SITE_OTHER): Payer: Medicare Other | Admitting: *Deleted

## 2014-05-13 DIAGNOSIS — I455 Other specified heart block: Secondary | ICD-10-CM

## 2014-05-13 DIAGNOSIS — I472 Ventricular tachycardia: Secondary | ICD-10-CM

## 2014-05-13 DIAGNOSIS — I4729 Other ventricular tachycardia: Secondary | ICD-10-CM

## 2014-05-14 ENCOUNTER — Encounter: Payer: Self-pay | Admitting: Internal Medicine

## 2014-05-14 DIAGNOSIS — I472 Ventricular tachycardia: Secondary | ICD-10-CM | POA: Diagnosis not present

## 2014-05-14 DIAGNOSIS — I455 Other specified heart block: Secondary | ICD-10-CM | POA: Diagnosis not present

## 2014-05-16 DIAGNOSIS — H4011X1 Primary open-angle glaucoma, mild stage: Secondary | ICD-10-CM | POA: Diagnosis not present

## 2014-05-16 DIAGNOSIS — H40051 Ocular hypertension, right eye: Secondary | ICD-10-CM | POA: Diagnosis not present

## 2014-05-16 NOTE — Progress Notes (Signed)
Remote pacemaker transmission.   

## 2014-05-17 LAB — MDC_IDC_ENUM_SESS_TYPE_REMOTE
Battery Remaining Longevity: 151 mo
Battery Voltage: 2.79 V
Lead Channel Pacing Threshold Amplitude: 0.625 V
Lead Channel Sensing Intrinsic Amplitude: 1 mV
Lead Channel Sensing Intrinsic Amplitude: 5.6 mV
Lead Channel Setting Pacing Amplitude: 2 V
Lead Channel Setting Pacing Amplitude: 2.5 V
Lead Channel Setting Pacing Pulse Width: 0.4 ms
MDC IDC MSMT BATTERY IMPEDANCE: 134 Ohm
MDC IDC MSMT LEADCHNL RA IMPEDANCE VALUE: 464 Ohm
MDC IDC MSMT LEADCHNL RA PACING THRESHOLD AMPLITUDE: 0.375 V
MDC IDC MSMT LEADCHNL RA PACING THRESHOLD PULSEWIDTH: 0.4 ms
MDC IDC MSMT LEADCHNL RV IMPEDANCE VALUE: 591 Ohm
MDC IDC MSMT LEADCHNL RV PACING THRESHOLD PULSEWIDTH: 0.4 ms
MDC IDC SESS DTM: 20151017205914
MDC IDC SET LEADCHNL RV SENSING SENSITIVITY: 2 mV
MDC IDC STAT BRADY AP VP PERCENT: 4 %
MDC IDC STAT BRADY AP VS PERCENT: 8 %
MDC IDC STAT BRADY AS VP PERCENT: 0 %
MDC IDC STAT BRADY AS VS PERCENT: 87 %

## 2014-06-01 ENCOUNTER — Encounter: Payer: Self-pay | Admitting: Cardiology

## 2014-06-29 DIAGNOSIS — R413 Other amnesia: Secondary | ICD-10-CM | POA: Diagnosis not present

## 2014-07-06 ENCOUNTER — Encounter (HOSPITAL_COMMUNITY): Payer: Self-pay | Admitting: Interventional Cardiology

## 2014-07-07 DIAGNOSIS — H16103 Unspecified superficial keratitis, bilateral: Secondary | ICD-10-CM | POA: Diagnosis not present

## 2014-07-07 DIAGNOSIS — H5340 Unspecified visual field defects: Secondary | ICD-10-CM | POA: Diagnosis not present

## 2014-07-07 DIAGNOSIS — H4011X2 Primary open-angle glaucoma, moderate stage: Secondary | ICD-10-CM | POA: Diagnosis not present

## 2014-07-07 DIAGNOSIS — H04123 Dry eye syndrome of bilateral lacrimal glands: Secondary | ICD-10-CM | POA: Diagnosis not present

## 2014-08-15 ENCOUNTER — Ambulatory Visit (INDEPENDENT_AMBULATORY_CARE_PROVIDER_SITE_OTHER): Payer: Medicare Other | Admitting: *Deleted

## 2014-08-15 ENCOUNTER — Encounter: Payer: Self-pay | Admitting: Internal Medicine

## 2014-08-15 DIAGNOSIS — I472 Ventricular tachycardia: Secondary | ICD-10-CM | POA: Diagnosis not present

## 2014-08-15 DIAGNOSIS — I455 Other specified heart block: Secondary | ICD-10-CM | POA: Diagnosis not present

## 2014-08-15 DIAGNOSIS — I4729 Other ventricular tachycardia: Secondary | ICD-10-CM

## 2014-08-15 NOTE — Progress Notes (Signed)
Remote pacemaker transmission.   

## 2014-08-16 LAB — MDC_IDC_ENUM_SESS_TYPE_REMOTE
Battery Impedance: 157 Ohm
Battery Remaining Longevity: 145 mo
Brady Statistic AP VP Percent: 4 %
Brady Statistic AP VS Percent: 10 %
Brady Statistic AS VP Percent: 0 %
Date Time Interrogation Session: 20160118143138
Lead Channel Impedance Value: 505 Ohm
Lead Channel Impedance Value: 578 Ohm
Lead Channel Pacing Threshold Amplitude: 0.375 V
Lead Channel Pacing Threshold Amplitude: 0.625 V
Lead Channel Pacing Threshold Pulse Width: 0.4 ms
Lead Channel Pacing Threshold Pulse Width: 0.4 ms
Lead Channel Sensing Intrinsic Amplitude: 1 mV
Lead Channel Sensing Intrinsic Amplitude: 5.6 mV
Lead Channel Setting Pacing Amplitude: 2 V
Lead Channel Setting Sensing Sensitivity: 2 mV
MDC IDC MSMT BATTERY VOLTAGE: 2.79 V
MDC IDC SET LEADCHNL RV PACING AMPLITUDE: 2.5 V
MDC IDC SET LEADCHNL RV PACING PULSEWIDTH: 0.4 ms
MDC IDC STAT BRADY AS VS PERCENT: 86 %

## 2014-09-05 ENCOUNTER — Encounter: Payer: Self-pay | Admitting: Cardiology

## 2014-12-01 ENCOUNTER — Ambulatory Visit (INDEPENDENT_AMBULATORY_CARE_PROVIDER_SITE_OTHER): Payer: Medicare Other | Admitting: Internal Medicine

## 2014-12-01 ENCOUNTER — Encounter: Payer: Self-pay | Admitting: Internal Medicine

## 2014-12-01 VITALS — BP 121/71 | HR 60 | Ht 65.0 in | Wt 137.6 lb

## 2014-12-01 DIAGNOSIS — I495 Sick sinus syndrome: Secondary | ICD-10-CM

## 2014-12-01 DIAGNOSIS — I4729 Other ventricular tachycardia: Secondary | ICD-10-CM

## 2014-12-01 DIAGNOSIS — Z45018 Encounter for adjustment and management of other part of cardiac pacemaker: Secondary | ICD-10-CM

## 2014-12-01 DIAGNOSIS — I472 Ventricular tachycardia: Secondary | ICD-10-CM | POA: Diagnosis not present

## 2014-12-01 DIAGNOSIS — I455 Other specified heart block: Secondary | ICD-10-CM | POA: Diagnosis not present

## 2014-12-01 LAB — CUP PACEART INCLINIC DEVICE CHECK
Battery Impedance: 157 Ohm
Battery Remaining Longevity: 144 mo
Battery Voltage: 2.79 V
Brady Statistic AP VP Percent: 5 %
Brady Statistic AS VS Percent: 83 %
Date Time Interrogation Session: 20160505104801
Lead Channel Impedance Value: 521 Ohm
Lead Channel Impedance Value: 592 Ohm
Lead Channel Sensing Intrinsic Amplitude: 2 mV
Lead Channel Setting Pacing Amplitude: 2.5 V
Lead Channel Setting Pacing Pulse Width: 0.4 ms
Lead Channel Setting Sensing Sensitivity: 2 mV
MDC IDC MSMT LEADCHNL RA PACING THRESHOLD AMPLITUDE: 0.5 V
MDC IDC MSMT LEADCHNL RA PACING THRESHOLD PULSEWIDTH: 0.4 ms
MDC IDC MSMT LEADCHNL RV PACING THRESHOLD AMPLITUDE: 0.5 V
MDC IDC MSMT LEADCHNL RV PACING THRESHOLD PULSEWIDTH: 0.4 ms
MDC IDC MSMT LEADCHNL RV SENSING INTR AMPL: 4 mV
MDC IDC SET LEADCHNL RA PACING AMPLITUDE: 2 V
MDC IDC STAT BRADY AP VS PERCENT: 12 %
MDC IDC STAT BRADY AS VP PERCENT: 0 %

## 2014-12-01 NOTE — Progress Notes (Signed)
Patient Care Team: Merlene LaughterHal Stoneking, MD as PCP - General (Internal Medicine)   HPI  Deborah Anderson is a 69 y.o. woman ( wife of Deborah FeltsRon Anderson) Seen in followup for a pacemaker implanted for asystole associated with syncope that was followed by polymorphic ventricular tachycardia. Date of implant December 2012.  Catheterization at that time had demonstrated an 80% circumflex lesion and she underwent DES placement  Investigations as to underlying cause included Lyme titers-negative; UPEP-negative; SPEP-nonspecific increase; ferritin-normal; sedimentation rate-normal. She saw neurology consultation; she has some dementia but no unifying diagnosis was identified.    The patient denies chest pain, shortness of breath, nocturnal dyspnea, orthopnea or peripheral edema.  There have been no palpitations, lightheadedness or syncope.    Her dementia is worse He has been treated with new drug for lymphoma  Past Medical History  Diagnosis Date  . Intraocular pressure increase   . Rhinitis, allergic   . CAD (coronary artery disease)     Newly diagnosed by cath s/p DES to LCX 07/06/11. Normal EF by echo 06/2011.  Marland Kitchen. Syncope     Documented asystole followed by polymorphic VT on telemetry. s/p Medtronic PPM implantation 07/08/11. Cause of sinus arrest unclear, lyme titers pending     Past Surgical History  Procedure Laterality Date  . Appendectomy    . Tonsillectomy    . Abdominal hysterectomy    . Tonsillectomy    . Tubal ligation    . Pacemaker insertion    . Temporary pacemaker insertion N/A 07/06/2011    Procedure: TEMPORARY PACEMAKER INSERTION;  Surgeon: Corky CraftsJayadeep S Varanasi, MD;  [facility];  Service: Cardiovascular;  Laterality: N/A;  . Left heart catheterization with coronary angiogram N/A 07/06/2011    Procedure: LEFT HEART CATHETERIZATION WITH CORONARY ANGIOGRAM;  Surgeon: Corky CraftsJayadeep S Varanasi, MD;  [facility];  Service: Cardiovascular;  Laterality: N/A;  .  Percutaneous coronary stent intervention (pci-s)  07/06/2011    Procedure: PERCUTANEOUS CORONARY STENT INTERVENTION (PCI-S);  Surgeon: Corky CraftsJayadeep S Varanasi, MD;  [facility];  Service: Cardiovascular;;  . Permanent pacemaker insertion N/A 07/08/2011    Procedure: PERMANENT PACEMAKER INSERTION;  Surgeon: Duke SalviaSteven C Onedia Vargus, MD;  [facility];  Service: Cardiovascular;  Laterality: N/A;    Current Outpatient Prescriptions  Medication Sig Dispense Refill  . aspirin 81 MG tablet Take 1 tablet (81 mg total) by mouth daily.    . Multiple Vitamin (MULTIVITAMIN) tablet Take 1 tablet by mouth daily. 90 tablet 1  . sertraline (ZOLOFT) 50 MG tablet Take 50 mg by mouth at bedtime.   0  . timolol (BETIMOL) 0.25 % ophthalmic solution Place 1 drop into both eyes 2 (two) times daily.     Marland Kitchen. triamcinolone cream (KENALOG) 0.1 % as needed.     No current facility-administered medications for this visit.    No Known Allergies  Review of Systems negative except from HPI and PMH  Physical Exam BP 121/71 mmHg  Pulse 60  Ht 5\' 5"  (1.651 m)  Wt 137 lb 9.6 oz (62.415 kg)  BMI 22.90 kg/m2 Well developed and well nourished in no acute distress HENT normal E scleral and icterus clear Neck Supple JVP flat; carotids brisk and full Clear to ausculation  Regular rate and rhythm, no murmurs gallops or rub Soft with active bowel sounds No clubbing cyanosis no Edema Alert and oriented, grossly normal motor and sensory function Skin Warm and Dry  ECG demonstrates sinus rhythm at  60 Intervals 16/09/39  Assessment and  Plan  Bradycardia    VT-polymorphic  Pause dependent No intercurrent Ventricular tachycardia   Pacemaker Medtronic  The patient's device was interrogated.  The information was reviewed. No changes were made in the programming.     Dementia  stqble continue current plan

## 2014-12-12 DIAGNOSIS — F418 Other specified anxiety disorders: Secondary | ICD-10-CM | POA: Diagnosis not present

## 2014-12-12 DIAGNOSIS — R413 Other amnesia: Secondary | ICD-10-CM | POA: Diagnosis not present

## 2014-12-20 DIAGNOSIS — R413 Other amnesia: Secondary | ICD-10-CM | POA: Diagnosis not present

## 2014-12-20 DIAGNOSIS — R251 Tremor, unspecified: Secondary | ICD-10-CM | POA: Diagnosis not present

## 2014-12-20 DIAGNOSIS — F329 Major depressive disorder, single episode, unspecified: Secondary | ICD-10-CM | POA: Diagnosis not present

## 2015-02-12 DIAGNOSIS — F0280 Dementia in other diseases classified elsewhere without behavioral disturbance: Secondary | ICD-10-CM | POA: Diagnosis not present

## 2015-02-12 DIAGNOSIS — G309 Alzheimer's disease, unspecified: Secondary | ICD-10-CM | POA: Diagnosis not present

## 2015-03-02 ENCOUNTER — Telehealth: Payer: Self-pay | Admitting: Cardiology

## 2015-03-02 ENCOUNTER — Encounter: Payer: Medicare Other | Admitting: *Deleted

## 2015-03-02 NOTE — Telephone Encounter (Signed)
LMOVM reminding pt to send remote transmission.   

## 2015-03-03 ENCOUNTER — Encounter: Payer: Self-pay | Admitting: Cardiology

## 2015-03-05 DIAGNOSIS — F6089 Other specific personality disorders: Secondary | ICD-10-CM | POA: Diagnosis not present

## 2015-03-27 ENCOUNTER — Ambulatory Visit (INDEPENDENT_AMBULATORY_CARE_PROVIDER_SITE_OTHER): Payer: Medicare Other | Admitting: *Deleted

## 2015-03-27 DIAGNOSIS — I495 Sick sinus syndrome: Secondary | ICD-10-CM | POA: Diagnosis not present

## 2015-03-28 NOTE — Progress Notes (Signed)
Remote pacemaker transmission.   

## 2015-04-09 LAB — CUP PACEART REMOTE DEVICE CHECK
Battery Impedance: 157 Ohm
Battery Voltage: 2.79 V
Brady Statistic AP VS Percent: 15 %
Brady Statistic AS VP Percent: 0 %
Brady Statistic AS VS Percent: 82 %
Lead Channel Impedance Value: 492 Ohm
Lead Channel Impedance Value: 588 Ohm
Lead Channel Pacing Threshold Pulse Width: 0.4 ms
Lead Channel Pacing Threshold Pulse Width: 0.4 ms
Lead Channel Sensing Intrinsic Amplitude: 1 mV
Lead Channel Sensing Intrinsic Amplitude: 5.6 mV
Lead Channel Setting Pacing Amplitude: 2 V
Lead Channel Setting Pacing Pulse Width: 0.46 ms
Lead Channel Setting Sensing Sensitivity: 2 mV
MDC IDC MSMT BATTERY REMAINING LONGEVITY: 143 mo
MDC IDC MSMT LEADCHNL RA PACING THRESHOLD AMPLITUDE: 0.375 V
MDC IDC MSMT LEADCHNL RV PACING THRESHOLD AMPLITUDE: 0.5 V
MDC IDC SESS DTM: 20160829130026
MDC IDC SET LEADCHNL RV PACING AMPLITUDE: 2.5 V
MDC IDC STAT BRADY AP VP PERCENT: 3 %

## 2015-05-03 ENCOUNTER — Encounter: Payer: Self-pay | Admitting: Cardiology

## 2015-05-09 ENCOUNTER — Encounter: Payer: Self-pay | Admitting: Internal Medicine

## 2015-06-28 ENCOUNTER — Telehealth: Payer: Self-pay | Admitting: Cardiology

## 2015-06-28 ENCOUNTER — Ambulatory Visit (INDEPENDENT_AMBULATORY_CARE_PROVIDER_SITE_OTHER): Payer: Medicare Other | Admitting: *Deleted

## 2015-06-28 DIAGNOSIS — I495 Sick sinus syndrome: Secondary | ICD-10-CM | POA: Diagnosis not present

## 2015-06-28 NOTE — Telephone Encounter (Signed)
LMOVM reminding pt to send remote transmission.   

## 2015-06-30 NOTE — Progress Notes (Signed)
Remote pacemaker transmission.   

## 2015-07-06 LAB — CUP PACEART REMOTE DEVICE CHECK
Battery Remaining Longevity: 138 mo
Battery Voltage: 2.79 V
Implantable Lead Implant Date: 20121210
Implantable Lead Location: 753859
Lead Channel Impedance Value: 476 Ohm
Lead Channel Impedance Value: 661 Ohm
Lead Channel Setting Pacing Amplitude: 2 V
MDC IDC LEAD IMPLANT DT: 20121210
MDC IDC LEAD LOCATION: 753860
MDC IDC LEAD MODEL: 1944
MDC IDC LEAD MODEL: 1948
MDC IDC MSMT BATTERY IMPEDANCE: 181 Ohm
MDC IDC SESS DTM: 20161201140829
MDC IDC SET LEADCHNL RV PACING AMPLITUDE: 2.5 V
MDC IDC SET LEADCHNL RV PACING PULSEWIDTH: 0.4 ms
MDC IDC SET LEADCHNL RV SENSING SENSITIVITY: 2 mV
MDC IDC STAT BRADY AP VP PERCENT: 3 %
MDC IDC STAT BRADY AP VS PERCENT: 16 %
MDC IDC STAT BRADY AS VP PERCENT: 0 %
MDC IDC STAT BRADY AS VS PERCENT: 80 %

## 2015-07-11 ENCOUNTER — Encounter: Payer: Self-pay | Admitting: Cardiology

## 2015-09-27 ENCOUNTER — Ambulatory Visit (INDEPENDENT_AMBULATORY_CARE_PROVIDER_SITE_OTHER): Payer: Medicare Other | Admitting: *Deleted

## 2015-09-27 ENCOUNTER — Telehealth: Payer: Self-pay | Admitting: Cardiology

## 2015-09-27 DIAGNOSIS — I495 Sick sinus syndrome: Secondary | ICD-10-CM

## 2015-09-27 NOTE — Telephone Encounter (Signed)
Confirmed remote transmission w/ pt husband.   

## 2015-09-27 NOTE — Progress Notes (Signed)
Remote pacemaker transmission.   

## 2015-11-01 LAB — CUP PACEART REMOTE DEVICE CHECK
Battery Voltage: 2.79 V
Brady Statistic AP VP Percent: 4 %
Brady Statistic AP VS Percent: 18 %
Brady Statistic AS VP Percent: 0 %
Implantable Lead Implant Date: 20121210
Implantable Lead Implant Date: 20121210
Implantable Lead Location: 753859
Lead Channel Impedance Value: 433 Ohm
Lead Channel Impedance Value: 552 Ohm
Lead Channel Pacing Threshold Amplitude: 0.375 V
Lead Channel Pacing Threshold Amplitude: 0.625 V
Lead Channel Pacing Threshold Pulse Width: 0.4 ms
Lead Channel Pacing Threshold Pulse Width: 0.4 ms
Lead Channel Sensing Intrinsic Amplitude: 1 mV
Lead Channel Setting Pacing Amplitude: 2 V
Lead Channel Setting Pacing Amplitude: 2.5 V
Lead Channel Setting Pacing Pulse Width: 0.4 ms
Lead Channel Setting Sensing Sensitivity: 2 mV
MDC IDC LEAD LOCATION: 753860
MDC IDC LEAD MODEL: 1944
MDC IDC LEAD MODEL: 1948
MDC IDC MSMT BATTERY IMPEDANCE: 157 Ohm
MDC IDC MSMT BATTERY REMAINING LONGEVITY: 142 mo
MDC IDC MSMT LEADCHNL RV SENSING INTR AMPL: 5.6 mV
MDC IDC SESS DTM: 20170301150659
MDC IDC STAT BRADY AS VS PERCENT: 78 %

## 2015-11-07 ENCOUNTER — Encounter: Payer: Self-pay | Admitting: Cardiology

## 2015-11-15 DIAGNOSIS — Z1389 Encounter for screening for other disorder: Secondary | ICD-10-CM | POA: Diagnosis not present

## 2015-11-15 DIAGNOSIS — R269 Unspecified abnormalities of gait and mobility: Secondary | ICD-10-CM | POA: Diagnosis not present

## 2015-11-15 DIAGNOSIS — Z79899 Other long term (current) drug therapy: Secondary | ICD-10-CM | POA: Diagnosis not present

## 2015-11-15 DIAGNOSIS — G309 Alzheimer's disease, unspecified: Secondary | ICD-10-CM | POA: Diagnosis not present

## 2015-11-21 DIAGNOSIS — F0280 Dementia in other diseases classified elsewhere without behavioral disturbance: Secondary | ICD-10-CM | POA: Diagnosis not present

## 2015-11-21 DIAGNOSIS — M4806 Spinal stenosis, lumbar region: Secondary | ICD-10-CM | POA: Diagnosis not present

## 2015-11-21 DIAGNOSIS — R269 Unspecified abnormalities of gait and mobility: Secondary | ICD-10-CM | POA: Diagnosis not present

## 2015-11-21 DIAGNOSIS — I2510 Atherosclerotic heart disease of native coronary artery without angina pectoris: Secondary | ICD-10-CM | POA: Diagnosis not present

## 2015-11-21 DIAGNOSIS — G309 Alzheimer's disease, unspecified: Secondary | ICD-10-CM | POA: Diagnosis not present

## 2015-11-21 DIAGNOSIS — Z950 Presence of cardiac pacemaker: Secondary | ICD-10-CM | POA: Diagnosis not present

## 2015-11-24 DIAGNOSIS — Z950 Presence of cardiac pacemaker: Secondary | ICD-10-CM | POA: Diagnosis not present

## 2015-11-24 DIAGNOSIS — R269 Unspecified abnormalities of gait and mobility: Secondary | ICD-10-CM | POA: Diagnosis not present

## 2015-11-24 DIAGNOSIS — I2510 Atherosclerotic heart disease of native coronary artery without angina pectoris: Secondary | ICD-10-CM | POA: Diagnosis not present

## 2015-11-24 DIAGNOSIS — F0280 Dementia in other diseases classified elsewhere without behavioral disturbance: Secondary | ICD-10-CM | POA: Diagnosis not present

## 2015-11-24 DIAGNOSIS — M4806 Spinal stenosis, lumbar region: Secondary | ICD-10-CM | POA: Diagnosis not present

## 2015-11-24 DIAGNOSIS — G309 Alzheimer's disease, unspecified: Secondary | ICD-10-CM | POA: Diagnosis not present

## 2015-11-27 DIAGNOSIS — R269 Unspecified abnormalities of gait and mobility: Secondary | ICD-10-CM | POA: Diagnosis not present

## 2015-11-27 DIAGNOSIS — Z950 Presence of cardiac pacemaker: Secondary | ICD-10-CM | POA: Diagnosis not present

## 2015-11-27 DIAGNOSIS — M4806 Spinal stenosis, lumbar region: Secondary | ICD-10-CM | POA: Diagnosis not present

## 2015-11-27 DIAGNOSIS — F0280 Dementia in other diseases classified elsewhere without behavioral disturbance: Secondary | ICD-10-CM | POA: Diagnosis not present

## 2015-11-27 DIAGNOSIS — G309 Alzheimer's disease, unspecified: Secondary | ICD-10-CM | POA: Diagnosis not present

## 2015-11-27 DIAGNOSIS — I2510 Atherosclerotic heart disease of native coronary artery without angina pectoris: Secondary | ICD-10-CM | POA: Diagnosis not present

## 2015-11-29 DIAGNOSIS — F0280 Dementia in other diseases classified elsewhere without behavioral disturbance: Secondary | ICD-10-CM | POA: Diagnosis not present

## 2015-11-29 DIAGNOSIS — G309 Alzheimer's disease, unspecified: Secondary | ICD-10-CM | POA: Diagnosis not present

## 2015-11-29 DIAGNOSIS — Z950 Presence of cardiac pacemaker: Secondary | ICD-10-CM | POA: Diagnosis not present

## 2015-11-29 DIAGNOSIS — M4806 Spinal stenosis, lumbar region: Secondary | ICD-10-CM | POA: Diagnosis not present

## 2015-11-29 DIAGNOSIS — I2510 Atherosclerotic heart disease of native coronary artery without angina pectoris: Secondary | ICD-10-CM | POA: Diagnosis not present

## 2015-11-29 DIAGNOSIS — R269 Unspecified abnormalities of gait and mobility: Secondary | ICD-10-CM | POA: Diagnosis not present

## 2015-12-04 DIAGNOSIS — G309 Alzheimer's disease, unspecified: Secondary | ICD-10-CM | POA: Diagnosis not present

## 2015-12-04 DIAGNOSIS — M4806 Spinal stenosis, lumbar region: Secondary | ICD-10-CM | POA: Diagnosis not present

## 2015-12-04 DIAGNOSIS — Z950 Presence of cardiac pacemaker: Secondary | ICD-10-CM | POA: Diagnosis not present

## 2015-12-04 DIAGNOSIS — I2510 Atherosclerotic heart disease of native coronary artery without angina pectoris: Secondary | ICD-10-CM | POA: Diagnosis not present

## 2015-12-04 DIAGNOSIS — R269 Unspecified abnormalities of gait and mobility: Secondary | ICD-10-CM | POA: Diagnosis not present

## 2015-12-04 DIAGNOSIS — F0280 Dementia in other diseases classified elsewhere without behavioral disturbance: Secondary | ICD-10-CM | POA: Diagnosis not present

## 2015-12-06 DIAGNOSIS — Z950 Presence of cardiac pacemaker: Secondary | ICD-10-CM | POA: Diagnosis not present

## 2015-12-06 DIAGNOSIS — M4806 Spinal stenosis, lumbar region: Secondary | ICD-10-CM | POA: Diagnosis not present

## 2015-12-06 DIAGNOSIS — R269 Unspecified abnormalities of gait and mobility: Secondary | ICD-10-CM | POA: Diagnosis not present

## 2015-12-06 DIAGNOSIS — F0280 Dementia in other diseases classified elsewhere without behavioral disturbance: Secondary | ICD-10-CM | POA: Diagnosis not present

## 2015-12-06 DIAGNOSIS — G309 Alzheimer's disease, unspecified: Secondary | ICD-10-CM | POA: Diagnosis not present

## 2015-12-06 DIAGNOSIS — I2510 Atherosclerotic heart disease of native coronary artery without angina pectoris: Secondary | ICD-10-CM | POA: Diagnosis not present

## 2015-12-11 DIAGNOSIS — F0280 Dementia in other diseases classified elsewhere without behavioral disturbance: Secondary | ICD-10-CM | POA: Diagnosis not present

## 2015-12-11 DIAGNOSIS — I2510 Atherosclerotic heart disease of native coronary artery without angina pectoris: Secondary | ICD-10-CM | POA: Diagnosis not present

## 2015-12-11 DIAGNOSIS — Z950 Presence of cardiac pacemaker: Secondary | ICD-10-CM | POA: Diagnosis not present

## 2015-12-11 DIAGNOSIS — M4806 Spinal stenosis, lumbar region: Secondary | ICD-10-CM | POA: Diagnosis not present

## 2015-12-11 DIAGNOSIS — G309 Alzheimer's disease, unspecified: Secondary | ICD-10-CM | POA: Diagnosis not present

## 2015-12-11 DIAGNOSIS — R269 Unspecified abnormalities of gait and mobility: Secondary | ICD-10-CM | POA: Diagnosis not present

## 2015-12-13 DIAGNOSIS — R269 Unspecified abnormalities of gait and mobility: Secondary | ICD-10-CM | POA: Diagnosis not present

## 2015-12-13 DIAGNOSIS — I2510 Atherosclerotic heart disease of native coronary artery without angina pectoris: Secondary | ICD-10-CM | POA: Diagnosis not present

## 2015-12-13 DIAGNOSIS — F0280 Dementia in other diseases classified elsewhere without behavioral disturbance: Secondary | ICD-10-CM | POA: Diagnosis not present

## 2015-12-13 DIAGNOSIS — G309 Alzheimer's disease, unspecified: Secondary | ICD-10-CM | POA: Diagnosis not present

## 2015-12-13 DIAGNOSIS — Z950 Presence of cardiac pacemaker: Secondary | ICD-10-CM | POA: Diagnosis not present

## 2015-12-13 DIAGNOSIS — M4806 Spinal stenosis, lumbar region: Secondary | ICD-10-CM | POA: Diagnosis not present

## 2016-05-24 DIAGNOSIS — Z23 Encounter for immunization: Secondary | ICD-10-CM | POA: Diagnosis not present

## 2017-04-28 DIAGNOSIS — Z23 Encounter for immunization: Secondary | ICD-10-CM | POA: Diagnosis not present

## 2017-08-08 DIAGNOSIS — I69951 Hemiplegia and hemiparesis following unspecified cerebrovascular disease affecting right dominant side: Secondary | ICD-10-CM | POA: Diagnosis not present

## 2017-08-08 DIAGNOSIS — K219 Gastro-esophageal reflux disease without esophagitis: Secondary | ICD-10-CM | POA: Diagnosis not present

## 2017-08-08 DIAGNOSIS — I63312 Cerebral infarction due to thrombosis of left middle cerebral artery: Secondary | ICD-10-CM | POA: Diagnosis not present

## 2017-08-08 DIAGNOSIS — I4901 Ventricular fibrillation: Secondary | ICD-10-CM | POA: Diagnosis not present

## 2017-08-08 DIAGNOSIS — G4089 Other seizures: Secondary | ICD-10-CM | POA: Diagnosis not present

## 2017-08-08 DIAGNOSIS — E785 Hyperlipidemia, unspecified: Secondary | ICD-10-CM | POA: Diagnosis not present

## 2017-08-08 DIAGNOSIS — G309 Alzheimer's disease, unspecified: Secondary | ICD-10-CM | POA: Diagnosis not present

## 2017-08-08 DIAGNOSIS — J309 Allergic rhinitis, unspecified: Secondary | ICD-10-CM | POA: Diagnosis not present

## 2017-08-08 DIAGNOSIS — I2510 Atherosclerotic heart disease of native coronary artery without angina pectoris: Secondary | ICD-10-CM | POA: Diagnosis not present

## 2017-08-11 DIAGNOSIS — I63312 Cerebral infarction due to thrombosis of left middle cerebral artery: Secondary | ICD-10-CM | POA: Diagnosis not present

## 2017-08-11 DIAGNOSIS — I69951 Hemiplegia and hemiparesis following unspecified cerebrovascular disease affecting right dominant side: Secondary | ICD-10-CM | POA: Diagnosis not present

## 2017-08-11 DIAGNOSIS — I2510 Atherosclerotic heart disease of native coronary artery without angina pectoris: Secondary | ICD-10-CM | POA: Diagnosis not present

## 2017-08-11 DIAGNOSIS — I4901 Ventricular fibrillation: Secondary | ICD-10-CM | POA: Diagnosis not present

## 2017-08-11 DIAGNOSIS — G309 Alzheimer's disease, unspecified: Secondary | ICD-10-CM | POA: Diagnosis not present

## 2017-08-11 DIAGNOSIS — E785 Hyperlipidemia, unspecified: Secondary | ICD-10-CM | POA: Diagnosis not present

## 2017-08-14 DIAGNOSIS — I4901 Ventricular fibrillation: Secondary | ICD-10-CM | POA: Diagnosis not present

## 2017-08-14 DIAGNOSIS — I63312 Cerebral infarction due to thrombosis of left middle cerebral artery: Secondary | ICD-10-CM | POA: Diagnosis not present

## 2017-08-14 DIAGNOSIS — I69951 Hemiplegia and hemiparesis following unspecified cerebrovascular disease affecting right dominant side: Secondary | ICD-10-CM | POA: Diagnosis not present

## 2017-08-14 DIAGNOSIS — I2510 Atherosclerotic heart disease of native coronary artery without angina pectoris: Secondary | ICD-10-CM | POA: Diagnosis not present

## 2017-08-14 DIAGNOSIS — E785 Hyperlipidemia, unspecified: Secondary | ICD-10-CM | POA: Diagnosis not present

## 2017-08-14 DIAGNOSIS — G309 Alzheimer's disease, unspecified: Secondary | ICD-10-CM | POA: Diagnosis not present

## 2017-08-18 DIAGNOSIS — I2510 Atherosclerotic heart disease of native coronary artery without angina pectoris: Secondary | ICD-10-CM | POA: Diagnosis not present

## 2017-08-18 DIAGNOSIS — G309 Alzheimer's disease, unspecified: Secondary | ICD-10-CM | POA: Diagnosis not present

## 2017-08-18 DIAGNOSIS — I63312 Cerebral infarction due to thrombosis of left middle cerebral artery: Secondary | ICD-10-CM | POA: Diagnosis not present

## 2017-08-18 DIAGNOSIS — E785 Hyperlipidemia, unspecified: Secondary | ICD-10-CM | POA: Diagnosis not present

## 2017-08-18 DIAGNOSIS — I69951 Hemiplegia and hemiparesis following unspecified cerebrovascular disease affecting right dominant side: Secondary | ICD-10-CM | POA: Diagnosis not present

## 2017-08-18 DIAGNOSIS — I4901 Ventricular fibrillation: Secondary | ICD-10-CM | POA: Diagnosis not present

## 2017-08-21 DIAGNOSIS — I4901 Ventricular fibrillation: Secondary | ICD-10-CM | POA: Diagnosis not present

## 2017-08-21 DIAGNOSIS — E785 Hyperlipidemia, unspecified: Secondary | ICD-10-CM | POA: Diagnosis not present

## 2017-08-21 DIAGNOSIS — I2510 Atherosclerotic heart disease of native coronary artery without angina pectoris: Secondary | ICD-10-CM | POA: Diagnosis not present

## 2017-08-21 DIAGNOSIS — G309 Alzheimer's disease, unspecified: Secondary | ICD-10-CM | POA: Diagnosis not present

## 2017-08-21 DIAGNOSIS — I69951 Hemiplegia and hemiparesis following unspecified cerebrovascular disease affecting right dominant side: Secondary | ICD-10-CM | POA: Diagnosis not present

## 2017-08-21 DIAGNOSIS — I63312 Cerebral infarction due to thrombosis of left middle cerebral artery: Secondary | ICD-10-CM | POA: Diagnosis not present

## 2017-08-25 DIAGNOSIS — I69951 Hemiplegia and hemiparesis following unspecified cerebrovascular disease affecting right dominant side: Secondary | ICD-10-CM | POA: Diagnosis not present

## 2017-08-25 DIAGNOSIS — I4901 Ventricular fibrillation: Secondary | ICD-10-CM | POA: Diagnosis not present

## 2017-08-25 DIAGNOSIS — I63312 Cerebral infarction due to thrombosis of left middle cerebral artery: Secondary | ICD-10-CM | POA: Diagnosis not present

## 2017-08-25 DIAGNOSIS — G309 Alzheimer's disease, unspecified: Secondary | ICD-10-CM | POA: Diagnosis not present

## 2017-08-25 DIAGNOSIS — E785 Hyperlipidemia, unspecified: Secondary | ICD-10-CM | POA: Diagnosis not present

## 2017-08-25 DIAGNOSIS — I2510 Atherosclerotic heart disease of native coronary artery without angina pectoris: Secondary | ICD-10-CM | POA: Diagnosis not present

## 2017-08-28 DIAGNOSIS — I69951 Hemiplegia and hemiparesis following unspecified cerebrovascular disease affecting right dominant side: Secondary | ICD-10-CM | POA: Diagnosis not present

## 2017-08-28 DIAGNOSIS — I63312 Cerebral infarction due to thrombosis of left middle cerebral artery: Secondary | ICD-10-CM | POA: Diagnosis not present

## 2017-08-28 DIAGNOSIS — E785 Hyperlipidemia, unspecified: Secondary | ICD-10-CM | POA: Diagnosis not present

## 2017-08-28 DIAGNOSIS — G309 Alzheimer's disease, unspecified: Secondary | ICD-10-CM | POA: Diagnosis not present

## 2017-08-28 DIAGNOSIS — I4901 Ventricular fibrillation: Secondary | ICD-10-CM | POA: Diagnosis not present

## 2017-08-28 DIAGNOSIS — I2510 Atherosclerotic heart disease of native coronary artery without angina pectoris: Secondary | ICD-10-CM | POA: Diagnosis not present

## 2017-08-29 DIAGNOSIS — I4901 Ventricular fibrillation: Secondary | ICD-10-CM | POA: Diagnosis not present

## 2017-08-29 DIAGNOSIS — J309 Allergic rhinitis, unspecified: Secondary | ICD-10-CM | POA: Diagnosis not present

## 2017-08-29 DIAGNOSIS — K219 Gastro-esophageal reflux disease without esophagitis: Secondary | ICD-10-CM | POA: Diagnosis not present

## 2017-08-29 DIAGNOSIS — G4089 Other seizures: Secondary | ICD-10-CM | POA: Diagnosis not present

## 2017-08-29 DIAGNOSIS — E785 Hyperlipidemia, unspecified: Secondary | ICD-10-CM | POA: Diagnosis not present

## 2017-08-29 DIAGNOSIS — G309 Alzheimer's disease, unspecified: Secondary | ICD-10-CM | POA: Diagnosis not present

## 2017-08-29 DIAGNOSIS — I2510 Atherosclerotic heart disease of native coronary artery without angina pectoris: Secondary | ICD-10-CM | POA: Diagnosis not present

## 2017-08-29 DIAGNOSIS — I69951 Hemiplegia and hemiparesis following unspecified cerebrovascular disease affecting right dominant side: Secondary | ICD-10-CM | POA: Diagnosis not present

## 2017-08-29 DIAGNOSIS — I63312 Cerebral infarction due to thrombosis of left middle cerebral artery: Secondary | ICD-10-CM | POA: Diagnosis not present

## 2017-09-01 DIAGNOSIS — I2510 Atherosclerotic heart disease of native coronary artery without angina pectoris: Secondary | ICD-10-CM | POA: Diagnosis not present

## 2017-09-01 DIAGNOSIS — E785 Hyperlipidemia, unspecified: Secondary | ICD-10-CM | POA: Diagnosis not present

## 2017-09-01 DIAGNOSIS — I4901 Ventricular fibrillation: Secondary | ICD-10-CM | POA: Diagnosis not present

## 2017-09-01 DIAGNOSIS — G309 Alzheimer's disease, unspecified: Secondary | ICD-10-CM | POA: Diagnosis not present

## 2017-09-01 DIAGNOSIS — I69951 Hemiplegia and hemiparesis following unspecified cerebrovascular disease affecting right dominant side: Secondary | ICD-10-CM | POA: Diagnosis not present

## 2017-09-01 DIAGNOSIS — I63312 Cerebral infarction due to thrombosis of left middle cerebral artery: Secondary | ICD-10-CM | POA: Diagnosis not present

## 2017-09-04 DIAGNOSIS — I69951 Hemiplegia and hemiparesis following unspecified cerebrovascular disease affecting right dominant side: Secondary | ICD-10-CM | POA: Diagnosis not present

## 2017-09-04 DIAGNOSIS — E785 Hyperlipidemia, unspecified: Secondary | ICD-10-CM | POA: Diagnosis not present

## 2017-09-04 DIAGNOSIS — I2510 Atherosclerotic heart disease of native coronary artery without angina pectoris: Secondary | ICD-10-CM | POA: Diagnosis not present

## 2017-09-04 DIAGNOSIS — I4901 Ventricular fibrillation: Secondary | ICD-10-CM | POA: Diagnosis not present

## 2017-09-04 DIAGNOSIS — G309 Alzheimer's disease, unspecified: Secondary | ICD-10-CM | POA: Diagnosis not present

## 2017-09-04 DIAGNOSIS — I63312 Cerebral infarction due to thrombosis of left middle cerebral artery: Secondary | ICD-10-CM | POA: Diagnosis not present

## 2017-09-08 DIAGNOSIS — I69951 Hemiplegia and hemiparesis following unspecified cerebrovascular disease affecting right dominant side: Secondary | ICD-10-CM | POA: Diagnosis not present

## 2017-09-08 DIAGNOSIS — I2510 Atherosclerotic heart disease of native coronary artery without angina pectoris: Secondary | ICD-10-CM | POA: Diagnosis not present

## 2017-09-08 DIAGNOSIS — I4901 Ventricular fibrillation: Secondary | ICD-10-CM | POA: Diagnosis not present

## 2017-09-08 DIAGNOSIS — I63312 Cerebral infarction due to thrombosis of left middle cerebral artery: Secondary | ICD-10-CM | POA: Diagnosis not present

## 2017-09-08 DIAGNOSIS — E785 Hyperlipidemia, unspecified: Secondary | ICD-10-CM | POA: Diagnosis not present

## 2017-09-08 DIAGNOSIS — G309 Alzheimer's disease, unspecified: Secondary | ICD-10-CM | POA: Diagnosis not present

## 2017-09-11 DIAGNOSIS — I4901 Ventricular fibrillation: Secondary | ICD-10-CM | POA: Diagnosis not present

## 2017-09-11 DIAGNOSIS — E785 Hyperlipidemia, unspecified: Secondary | ICD-10-CM | POA: Diagnosis not present

## 2017-09-11 DIAGNOSIS — I63312 Cerebral infarction due to thrombosis of left middle cerebral artery: Secondary | ICD-10-CM | POA: Diagnosis not present

## 2017-09-11 DIAGNOSIS — I2510 Atherosclerotic heart disease of native coronary artery without angina pectoris: Secondary | ICD-10-CM | POA: Diagnosis not present

## 2017-09-11 DIAGNOSIS — I69951 Hemiplegia and hemiparesis following unspecified cerebrovascular disease affecting right dominant side: Secondary | ICD-10-CM | POA: Diagnosis not present

## 2017-09-11 DIAGNOSIS — G309 Alzheimer's disease, unspecified: Secondary | ICD-10-CM | POA: Diagnosis not present

## 2017-09-15 DIAGNOSIS — E785 Hyperlipidemia, unspecified: Secondary | ICD-10-CM | POA: Diagnosis not present

## 2017-09-15 DIAGNOSIS — I4901 Ventricular fibrillation: Secondary | ICD-10-CM | POA: Diagnosis not present

## 2017-09-15 DIAGNOSIS — I69951 Hemiplegia and hemiparesis following unspecified cerebrovascular disease affecting right dominant side: Secondary | ICD-10-CM | POA: Diagnosis not present

## 2017-09-15 DIAGNOSIS — I2510 Atherosclerotic heart disease of native coronary artery without angina pectoris: Secondary | ICD-10-CM | POA: Diagnosis not present

## 2017-09-15 DIAGNOSIS — I63312 Cerebral infarction due to thrombosis of left middle cerebral artery: Secondary | ICD-10-CM | POA: Diagnosis not present

## 2017-09-15 DIAGNOSIS — G309 Alzheimer's disease, unspecified: Secondary | ICD-10-CM | POA: Diagnosis not present

## 2017-09-18 DIAGNOSIS — I69951 Hemiplegia and hemiparesis following unspecified cerebrovascular disease affecting right dominant side: Secondary | ICD-10-CM | POA: Diagnosis not present

## 2017-09-18 DIAGNOSIS — I4901 Ventricular fibrillation: Secondary | ICD-10-CM | POA: Diagnosis not present

## 2017-09-18 DIAGNOSIS — G309 Alzheimer's disease, unspecified: Secondary | ICD-10-CM | POA: Diagnosis not present

## 2017-09-18 DIAGNOSIS — I2510 Atherosclerotic heart disease of native coronary artery without angina pectoris: Secondary | ICD-10-CM | POA: Diagnosis not present

## 2017-09-18 DIAGNOSIS — E785 Hyperlipidemia, unspecified: Secondary | ICD-10-CM | POA: Diagnosis not present

## 2017-09-18 DIAGNOSIS — I63312 Cerebral infarction due to thrombosis of left middle cerebral artery: Secondary | ICD-10-CM | POA: Diagnosis not present

## 2017-09-22 DIAGNOSIS — I2510 Atherosclerotic heart disease of native coronary artery without angina pectoris: Secondary | ICD-10-CM | POA: Diagnosis not present

## 2017-09-22 DIAGNOSIS — E785 Hyperlipidemia, unspecified: Secondary | ICD-10-CM | POA: Diagnosis not present

## 2017-09-22 DIAGNOSIS — I69951 Hemiplegia and hemiparesis following unspecified cerebrovascular disease affecting right dominant side: Secondary | ICD-10-CM | POA: Diagnosis not present

## 2017-09-22 DIAGNOSIS — I4901 Ventricular fibrillation: Secondary | ICD-10-CM | POA: Diagnosis not present

## 2017-09-22 DIAGNOSIS — G309 Alzheimer's disease, unspecified: Secondary | ICD-10-CM | POA: Diagnosis not present

## 2017-09-22 DIAGNOSIS — I63312 Cerebral infarction due to thrombosis of left middle cerebral artery: Secondary | ICD-10-CM | POA: Diagnosis not present

## 2017-09-25 DIAGNOSIS — I2510 Atherosclerotic heart disease of native coronary artery without angina pectoris: Secondary | ICD-10-CM | POA: Diagnosis not present

## 2017-09-25 DIAGNOSIS — G309 Alzheimer's disease, unspecified: Secondary | ICD-10-CM | POA: Diagnosis not present

## 2017-09-25 DIAGNOSIS — I69951 Hemiplegia and hemiparesis following unspecified cerebrovascular disease affecting right dominant side: Secondary | ICD-10-CM | POA: Diagnosis not present

## 2017-09-25 DIAGNOSIS — I4901 Ventricular fibrillation: Secondary | ICD-10-CM | POA: Diagnosis not present

## 2017-09-25 DIAGNOSIS — E785 Hyperlipidemia, unspecified: Secondary | ICD-10-CM | POA: Diagnosis not present

## 2017-09-25 DIAGNOSIS — I63312 Cerebral infarction due to thrombosis of left middle cerebral artery: Secondary | ICD-10-CM | POA: Diagnosis not present

## 2017-09-26 DIAGNOSIS — I69951 Hemiplegia and hemiparesis following unspecified cerebrovascular disease affecting right dominant side: Secondary | ICD-10-CM | POA: Diagnosis not present

## 2017-09-26 DIAGNOSIS — E785 Hyperlipidemia, unspecified: Secondary | ICD-10-CM | POA: Diagnosis not present

## 2017-09-26 DIAGNOSIS — I2510 Atherosclerotic heart disease of native coronary artery without angina pectoris: Secondary | ICD-10-CM | POA: Diagnosis not present

## 2017-09-26 DIAGNOSIS — I63312 Cerebral infarction due to thrombosis of left middle cerebral artery: Secondary | ICD-10-CM | POA: Diagnosis not present

## 2017-09-26 DIAGNOSIS — G309 Alzheimer's disease, unspecified: Secondary | ICD-10-CM | POA: Diagnosis not present

## 2017-09-26 DIAGNOSIS — J309 Allergic rhinitis, unspecified: Secondary | ICD-10-CM | POA: Diagnosis not present

## 2017-09-26 DIAGNOSIS — I4901 Ventricular fibrillation: Secondary | ICD-10-CM | POA: Diagnosis not present

## 2017-09-26 DIAGNOSIS — G4089 Other seizures: Secondary | ICD-10-CM | POA: Diagnosis not present

## 2017-09-26 DIAGNOSIS — K219 Gastro-esophageal reflux disease without esophagitis: Secondary | ICD-10-CM | POA: Diagnosis not present

## 2017-09-29 DIAGNOSIS — E785 Hyperlipidemia, unspecified: Secondary | ICD-10-CM | POA: Diagnosis not present

## 2017-09-29 DIAGNOSIS — I69951 Hemiplegia and hemiparesis following unspecified cerebrovascular disease affecting right dominant side: Secondary | ICD-10-CM | POA: Diagnosis not present

## 2017-09-29 DIAGNOSIS — I4901 Ventricular fibrillation: Secondary | ICD-10-CM | POA: Diagnosis not present

## 2017-09-29 DIAGNOSIS — I63312 Cerebral infarction due to thrombosis of left middle cerebral artery: Secondary | ICD-10-CM | POA: Diagnosis not present

## 2017-09-29 DIAGNOSIS — G309 Alzheimer's disease, unspecified: Secondary | ICD-10-CM | POA: Diagnosis not present

## 2017-09-29 DIAGNOSIS — I2510 Atherosclerotic heart disease of native coronary artery without angina pectoris: Secondary | ICD-10-CM | POA: Diagnosis not present

## 2017-10-02 DIAGNOSIS — I69951 Hemiplegia and hemiparesis following unspecified cerebrovascular disease affecting right dominant side: Secondary | ICD-10-CM | POA: Diagnosis not present

## 2017-10-02 DIAGNOSIS — I63312 Cerebral infarction due to thrombosis of left middle cerebral artery: Secondary | ICD-10-CM | POA: Diagnosis not present

## 2017-10-02 DIAGNOSIS — G309 Alzheimer's disease, unspecified: Secondary | ICD-10-CM | POA: Diagnosis not present

## 2017-10-02 DIAGNOSIS — E785 Hyperlipidemia, unspecified: Secondary | ICD-10-CM | POA: Diagnosis not present

## 2017-10-02 DIAGNOSIS — I4901 Ventricular fibrillation: Secondary | ICD-10-CM | POA: Diagnosis not present

## 2017-10-02 DIAGNOSIS — I2510 Atherosclerotic heart disease of native coronary artery without angina pectoris: Secondary | ICD-10-CM | POA: Diagnosis not present

## 2017-10-06 DIAGNOSIS — I2510 Atherosclerotic heart disease of native coronary artery without angina pectoris: Secondary | ICD-10-CM | POA: Diagnosis not present

## 2017-10-06 DIAGNOSIS — G309 Alzheimer's disease, unspecified: Secondary | ICD-10-CM | POA: Diagnosis not present

## 2017-10-06 DIAGNOSIS — I4901 Ventricular fibrillation: Secondary | ICD-10-CM | POA: Diagnosis not present

## 2017-10-06 DIAGNOSIS — I69951 Hemiplegia and hemiparesis following unspecified cerebrovascular disease affecting right dominant side: Secondary | ICD-10-CM | POA: Diagnosis not present

## 2017-10-06 DIAGNOSIS — I63312 Cerebral infarction due to thrombosis of left middle cerebral artery: Secondary | ICD-10-CM | POA: Diagnosis not present

## 2017-10-06 DIAGNOSIS — E785 Hyperlipidemia, unspecified: Secondary | ICD-10-CM | POA: Diagnosis not present

## 2017-10-09 DIAGNOSIS — I63312 Cerebral infarction due to thrombosis of left middle cerebral artery: Secondary | ICD-10-CM | POA: Diagnosis not present

## 2017-10-09 DIAGNOSIS — I2510 Atherosclerotic heart disease of native coronary artery without angina pectoris: Secondary | ICD-10-CM | POA: Diagnosis not present

## 2017-10-09 DIAGNOSIS — G309 Alzheimer's disease, unspecified: Secondary | ICD-10-CM | POA: Diagnosis not present

## 2017-10-09 DIAGNOSIS — I69951 Hemiplegia and hemiparesis following unspecified cerebrovascular disease affecting right dominant side: Secondary | ICD-10-CM | POA: Diagnosis not present

## 2017-10-09 DIAGNOSIS — E785 Hyperlipidemia, unspecified: Secondary | ICD-10-CM | POA: Diagnosis not present

## 2017-10-09 DIAGNOSIS — I4901 Ventricular fibrillation: Secondary | ICD-10-CM | POA: Diagnosis not present

## 2017-10-13 DIAGNOSIS — I69951 Hemiplegia and hemiparesis following unspecified cerebrovascular disease affecting right dominant side: Secondary | ICD-10-CM | POA: Diagnosis not present

## 2017-10-13 DIAGNOSIS — G309 Alzheimer's disease, unspecified: Secondary | ICD-10-CM | POA: Diagnosis not present

## 2017-10-13 DIAGNOSIS — I4901 Ventricular fibrillation: Secondary | ICD-10-CM | POA: Diagnosis not present

## 2017-10-13 DIAGNOSIS — I2510 Atherosclerotic heart disease of native coronary artery without angina pectoris: Secondary | ICD-10-CM | POA: Diagnosis not present

## 2017-10-13 DIAGNOSIS — E785 Hyperlipidemia, unspecified: Secondary | ICD-10-CM | POA: Diagnosis not present

## 2017-10-13 DIAGNOSIS — I63312 Cerebral infarction due to thrombosis of left middle cerebral artery: Secondary | ICD-10-CM | POA: Diagnosis not present

## 2017-10-16 DIAGNOSIS — I2510 Atherosclerotic heart disease of native coronary artery without angina pectoris: Secondary | ICD-10-CM | POA: Diagnosis not present

## 2017-10-16 DIAGNOSIS — G309 Alzheimer's disease, unspecified: Secondary | ICD-10-CM | POA: Diagnosis not present

## 2017-10-16 DIAGNOSIS — I69951 Hemiplegia and hemiparesis following unspecified cerebrovascular disease affecting right dominant side: Secondary | ICD-10-CM | POA: Diagnosis not present

## 2017-10-16 DIAGNOSIS — I4901 Ventricular fibrillation: Secondary | ICD-10-CM | POA: Diagnosis not present

## 2017-10-16 DIAGNOSIS — E785 Hyperlipidemia, unspecified: Secondary | ICD-10-CM | POA: Diagnosis not present

## 2017-10-16 DIAGNOSIS — I63312 Cerebral infarction due to thrombosis of left middle cerebral artery: Secondary | ICD-10-CM | POA: Diagnosis not present

## 2017-10-20 DIAGNOSIS — E785 Hyperlipidemia, unspecified: Secondary | ICD-10-CM | POA: Diagnosis not present

## 2017-10-20 DIAGNOSIS — I4901 Ventricular fibrillation: Secondary | ICD-10-CM | POA: Diagnosis not present

## 2017-10-20 DIAGNOSIS — I63312 Cerebral infarction due to thrombosis of left middle cerebral artery: Secondary | ICD-10-CM | POA: Diagnosis not present

## 2017-10-20 DIAGNOSIS — I2510 Atherosclerotic heart disease of native coronary artery without angina pectoris: Secondary | ICD-10-CM | POA: Diagnosis not present

## 2017-10-20 DIAGNOSIS — I69951 Hemiplegia and hemiparesis following unspecified cerebrovascular disease affecting right dominant side: Secondary | ICD-10-CM | POA: Diagnosis not present

## 2017-10-20 DIAGNOSIS — G309 Alzheimer's disease, unspecified: Secondary | ICD-10-CM | POA: Diagnosis not present

## 2017-10-21 DIAGNOSIS — G309 Alzheimer's disease, unspecified: Secondary | ICD-10-CM | POA: Diagnosis not present

## 2017-10-21 DIAGNOSIS — I2510 Atherosclerotic heart disease of native coronary artery without angina pectoris: Secondary | ICD-10-CM | POA: Diagnosis not present

## 2017-10-21 DIAGNOSIS — I69951 Hemiplegia and hemiparesis following unspecified cerebrovascular disease affecting right dominant side: Secondary | ICD-10-CM | POA: Diagnosis not present

## 2017-10-21 DIAGNOSIS — E785 Hyperlipidemia, unspecified: Secondary | ICD-10-CM | POA: Diagnosis not present

## 2017-10-21 DIAGNOSIS — I4901 Ventricular fibrillation: Secondary | ICD-10-CM | POA: Diagnosis not present

## 2017-10-21 DIAGNOSIS — I63312 Cerebral infarction due to thrombosis of left middle cerebral artery: Secondary | ICD-10-CM | POA: Diagnosis not present

## 2017-10-23 DIAGNOSIS — I69951 Hemiplegia and hemiparesis following unspecified cerebrovascular disease affecting right dominant side: Secondary | ICD-10-CM | POA: Diagnosis not present

## 2017-10-23 DIAGNOSIS — G309 Alzheimer's disease, unspecified: Secondary | ICD-10-CM | POA: Diagnosis not present

## 2017-10-23 DIAGNOSIS — I63312 Cerebral infarction due to thrombosis of left middle cerebral artery: Secondary | ICD-10-CM | POA: Diagnosis not present

## 2017-10-23 DIAGNOSIS — I4901 Ventricular fibrillation: Secondary | ICD-10-CM | POA: Diagnosis not present

## 2017-10-23 DIAGNOSIS — I2510 Atherosclerotic heart disease of native coronary artery without angina pectoris: Secondary | ICD-10-CM | POA: Diagnosis not present

## 2017-10-23 DIAGNOSIS — E785 Hyperlipidemia, unspecified: Secondary | ICD-10-CM | POA: Diagnosis not present

## 2017-10-27 DIAGNOSIS — G309 Alzheimer's disease, unspecified: Secondary | ICD-10-CM | POA: Diagnosis not present

## 2017-10-27 DIAGNOSIS — K219 Gastro-esophageal reflux disease without esophagitis: Secondary | ICD-10-CM | POA: Diagnosis not present

## 2017-10-27 DIAGNOSIS — I4901 Ventricular fibrillation: Secondary | ICD-10-CM | POA: Diagnosis not present

## 2017-10-27 DIAGNOSIS — E785 Hyperlipidemia, unspecified: Secondary | ICD-10-CM | POA: Diagnosis not present

## 2017-10-27 DIAGNOSIS — I2510 Atherosclerotic heart disease of native coronary artery without angina pectoris: Secondary | ICD-10-CM | POA: Diagnosis not present

## 2017-10-27 DIAGNOSIS — J309 Allergic rhinitis, unspecified: Secondary | ICD-10-CM | POA: Diagnosis not present

## 2017-10-27 DIAGNOSIS — G4089 Other seizures: Secondary | ICD-10-CM | POA: Diagnosis not present

## 2017-10-27 DIAGNOSIS — I63312 Cerebral infarction due to thrombosis of left middle cerebral artery: Secondary | ICD-10-CM | POA: Diagnosis not present

## 2017-10-27 DIAGNOSIS — I69951 Hemiplegia and hemiparesis following unspecified cerebrovascular disease affecting right dominant side: Secondary | ICD-10-CM | POA: Diagnosis not present

## 2017-10-29 DIAGNOSIS — I4901 Ventricular fibrillation: Secondary | ICD-10-CM | POA: Diagnosis not present

## 2017-10-29 DIAGNOSIS — I63312 Cerebral infarction due to thrombosis of left middle cerebral artery: Secondary | ICD-10-CM | POA: Diagnosis not present

## 2017-10-29 DIAGNOSIS — G309 Alzheimer's disease, unspecified: Secondary | ICD-10-CM | POA: Diagnosis not present

## 2017-10-29 DIAGNOSIS — I69951 Hemiplegia and hemiparesis following unspecified cerebrovascular disease affecting right dominant side: Secondary | ICD-10-CM | POA: Diagnosis not present

## 2017-10-29 DIAGNOSIS — E785 Hyperlipidemia, unspecified: Secondary | ICD-10-CM | POA: Diagnosis not present

## 2017-10-29 DIAGNOSIS — I2510 Atherosclerotic heart disease of native coronary artery without angina pectoris: Secondary | ICD-10-CM | POA: Diagnosis not present

## 2017-10-30 DIAGNOSIS — I63312 Cerebral infarction due to thrombosis of left middle cerebral artery: Secondary | ICD-10-CM | POA: Diagnosis not present

## 2017-10-30 DIAGNOSIS — I69951 Hemiplegia and hemiparesis following unspecified cerebrovascular disease affecting right dominant side: Secondary | ICD-10-CM | POA: Diagnosis not present

## 2017-10-30 DIAGNOSIS — G309 Alzheimer's disease, unspecified: Secondary | ICD-10-CM | POA: Diagnosis not present

## 2017-10-30 DIAGNOSIS — I4901 Ventricular fibrillation: Secondary | ICD-10-CM | POA: Diagnosis not present

## 2017-10-30 DIAGNOSIS — E785 Hyperlipidemia, unspecified: Secondary | ICD-10-CM | POA: Diagnosis not present

## 2017-10-30 DIAGNOSIS — I2510 Atherosclerotic heart disease of native coronary artery without angina pectoris: Secondary | ICD-10-CM | POA: Diagnosis not present

## 2017-11-26 DEATH — deceased
